# Patient Record
Sex: Female | Born: 1980 | Race: White | Hispanic: No | Marital: Married | Smoking: Never smoker
Health system: Southern US, Community
[De-identification: ages and names within clinical notes are randomized; demographics above are authoritative.]

## PROBLEM LIST (undated history)

## (undated) DIAGNOSIS — T7840XA Allergy, unspecified, initial encounter: Secondary | ICD-10-CM

## (undated) DIAGNOSIS — R87629 Unspecified abnormal cytological findings in specimens from vagina: Secondary | ICD-10-CM

## (undated) DIAGNOSIS — Z9889 Other specified postprocedural states: Secondary | ICD-10-CM

## (undated) DIAGNOSIS — J45909 Unspecified asthma, uncomplicated: Secondary | ICD-10-CM

## (undated) DIAGNOSIS — R112 Nausea with vomiting, unspecified: Secondary | ICD-10-CM

## (undated) HISTORY — PX: WISDOM TOOTH EXTRACTION: SHX21

## (undated) HISTORY — DX: Unspecified asthma, uncomplicated: J45.909

## (undated) HISTORY — DX: Unspecified abnormal cytological findings in specimens from vagina: R87.629

## (undated) HISTORY — PX: COLPOSCOPY: SHX161

## (undated) HISTORY — DX: Other specified postprocedural states: Z98.890

## (undated) HISTORY — DX: Other specified postprocedural states: R11.2

## (undated) HISTORY — DX: Allergy, unspecified, initial encounter: T78.40XA

---

## 2000-07-09 ENCOUNTER — Other Ambulatory Visit: Admission: RE | Admit: 2000-07-09 | Discharge: 2000-07-09 | Payer: Self-pay | Admitting: Obstetrics and Gynecology

## 2001-10-28 ENCOUNTER — Other Ambulatory Visit: Admission: RE | Admit: 2001-10-28 | Discharge: 2001-10-28 | Payer: Self-pay | Admitting: Obstetrics & Gynecology

## 2002-11-04 ENCOUNTER — Other Ambulatory Visit: Admission: RE | Admit: 2002-11-04 | Discharge: 2002-11-04 | Payer: Self-pay | Admitting: Obstetrics & Gynecology

## 2003-06-02 ENCOUNTER — Other Ambulatory Visit: Admission: RE | Admit: 2003-06-02 | Discharge: 2003-06-02 | Payer: Self-pay | Admitting: Obstetrics & Gynecology

## 2007-10-03 ENCOUNTER — Emergency Department (HOSPITAL_COMMUNITY): Admission: EM | Admit: 2007-10-03 | Discharge: 2007-10-03 | Payer: Self-pay | Admitting: Family Medicine

## 2007-10-14 ENCOUNTER — Encounter: Admission: RE | Admit: 2007-10-14 | Discharge: 2007-10-14 | Payer: Self-pay | Admitting: Gastroenterology

## 2008-12-13 ENCOUNTER — Emergency Department (HOSPITAL_COMMUNITY): Admission: EM | Admit: 2008-12-13 | Discharge: 2008-12-13 | Payer: Self-pay | Admitting: Family Medicine

## 2009-10-09 IMAGING — CT CT ABDOMEN W/ CM
2 of 5 series · 14 of 42 positions shown, 19 images · IV contrast (READICAT/WATER & [ID] OMNI 300)
Comparison: None

CT ABDOMEN

CLINICAL DATA: Right lower quadrant pain

CT ABDOMEN AND PELVIS WITH CONTRAST
TECHNIQUE: Multidetector CT imaging of the abdomen and pelvis was
performed using the standard protocol following bolus
administration of intravenous contrast.
Contrast: 100 ml

[Series 2: abdomen w/ · axial · 0.70mm/px · z∈[-340,+15]mm · 11 of 81 slices shown, 16 images]
[im 5/81  soft-tissue]
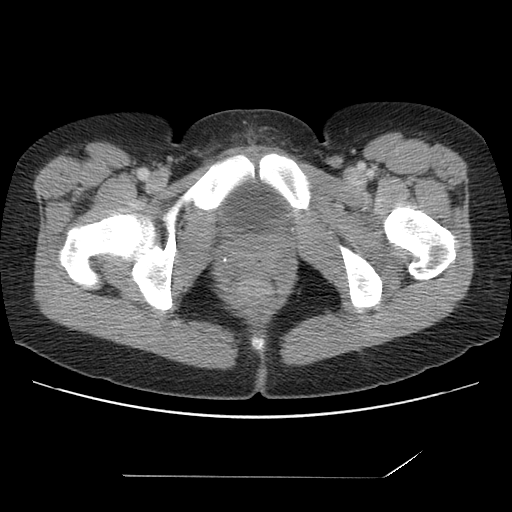
[im 5/81  bone]
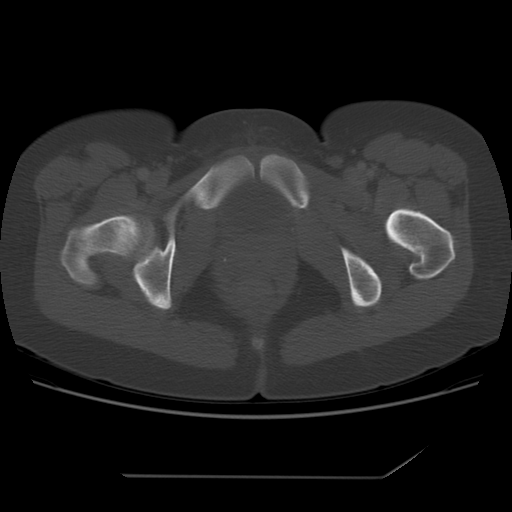
[im 15/81  soft-tissue]
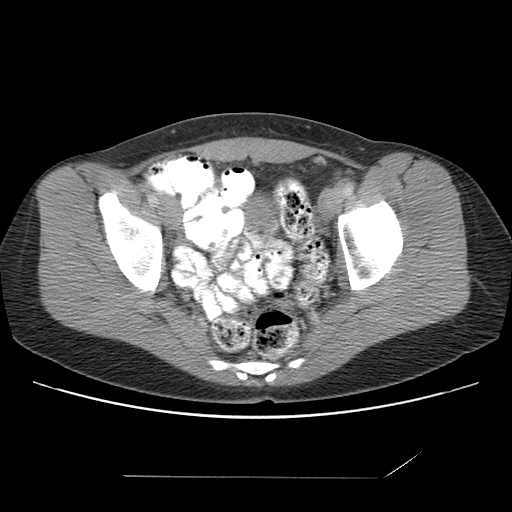
[im 24/81  soft-tissue]
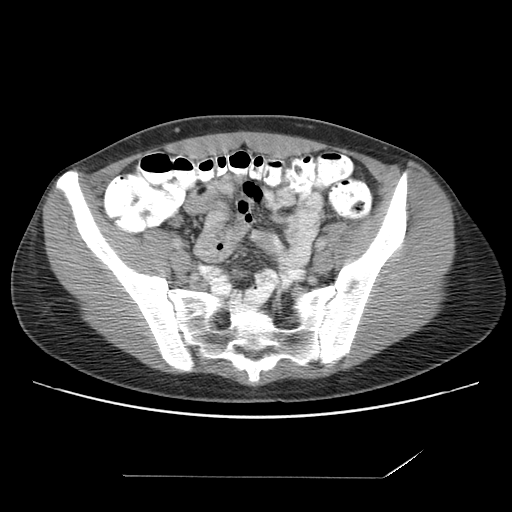
[im 29/81  soft-tissue]
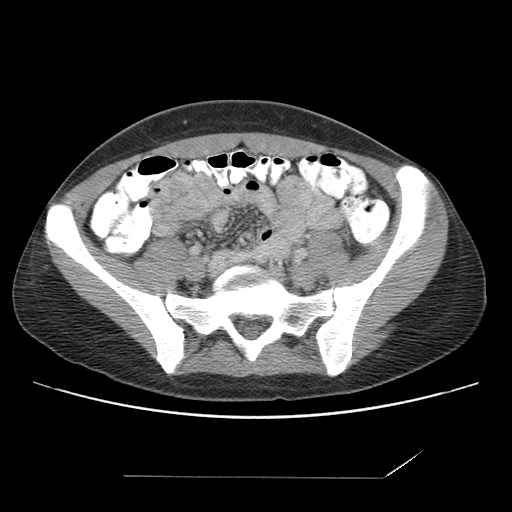
[im 38/81  soft-tissue]
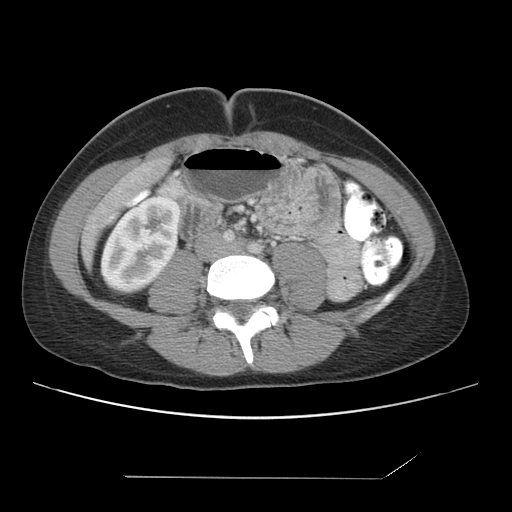
[im 43/81  soft-tissue]
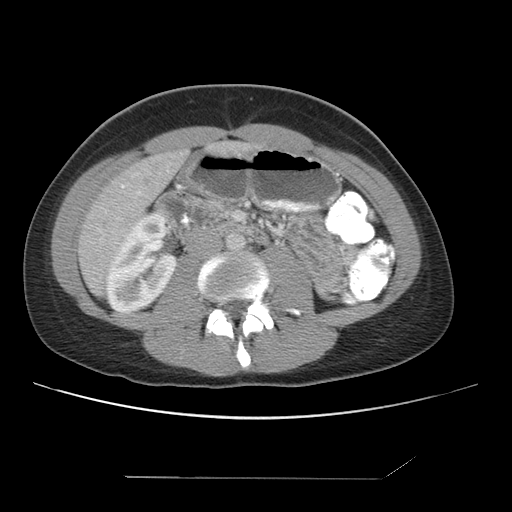
[im 52/81  soft-tissue]
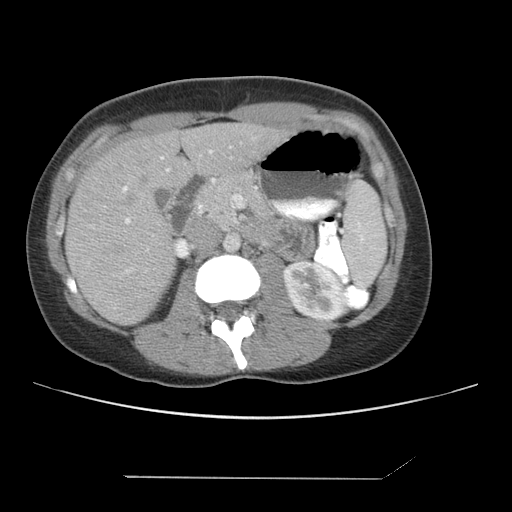
[im 62/81  soft-tissue]
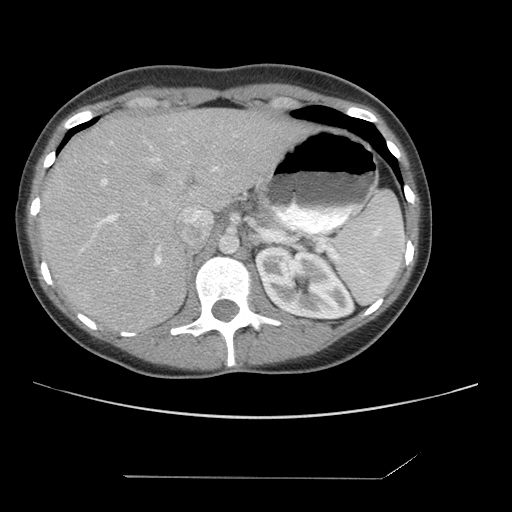
[im 62/81  lung]
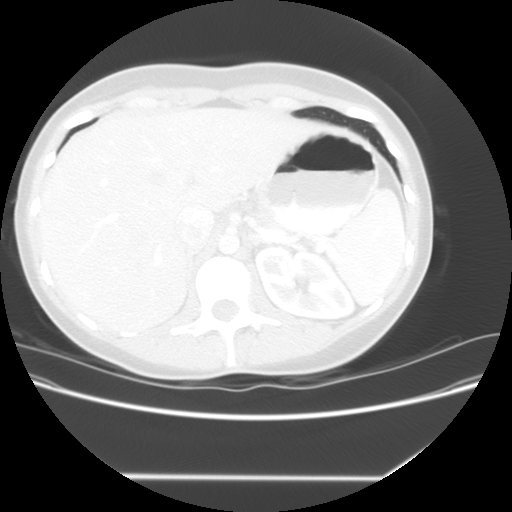
[im 66/81  soft-tissue]
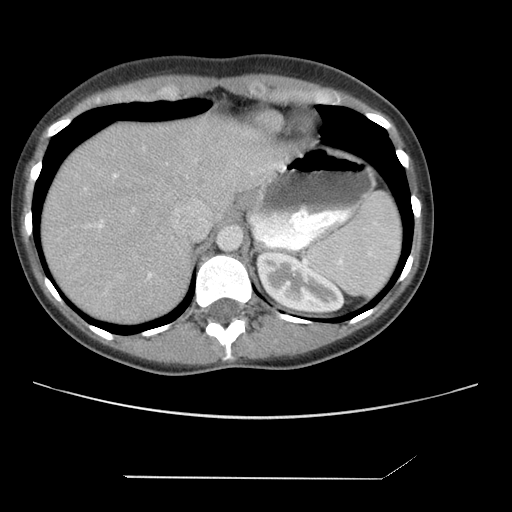
[im 66/81  lung]
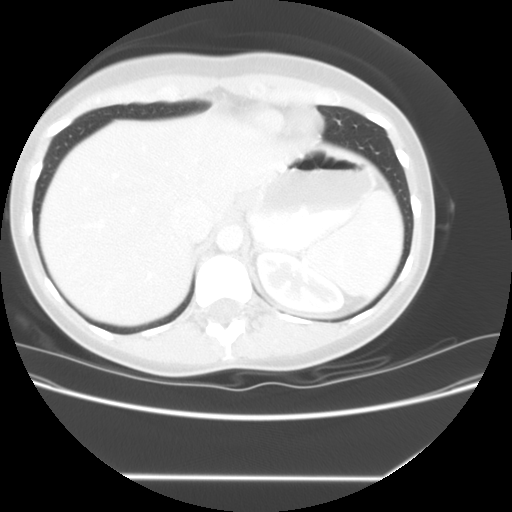
[im 66/81  bone]
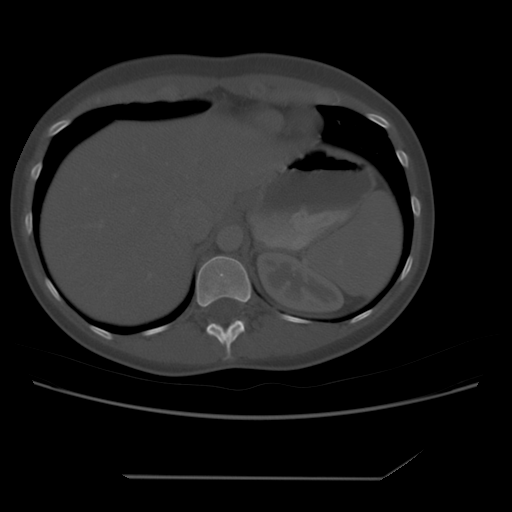
[im 71/81  lung]
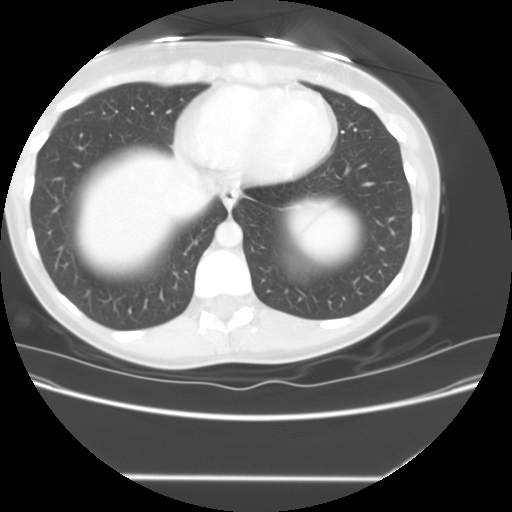
[im 76/81  soft-tissue]
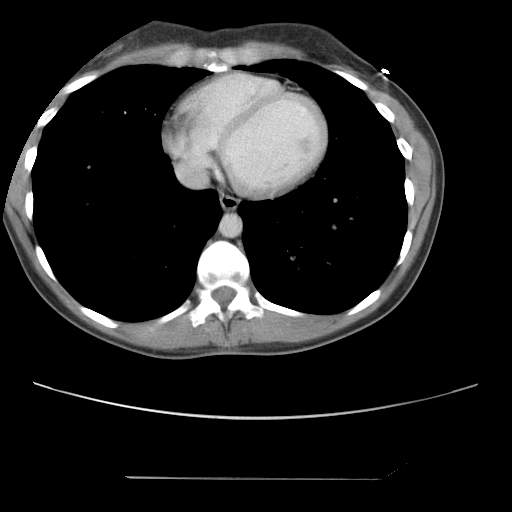
[im 76/81  lung]
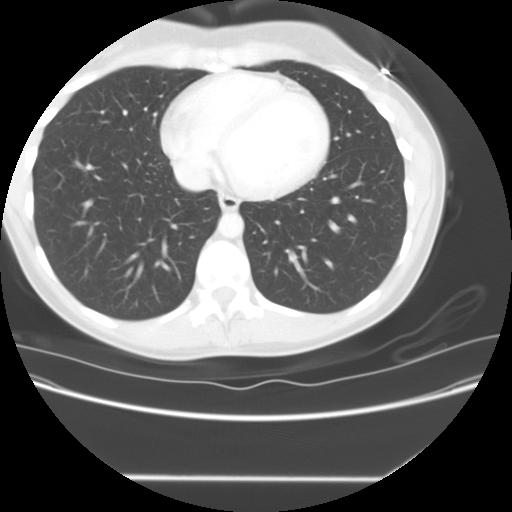

[Series 400: sagittal · sagittal · 0.86mm/px · 3 of 114 slices shown]
[im 29/114  soft-tissue]
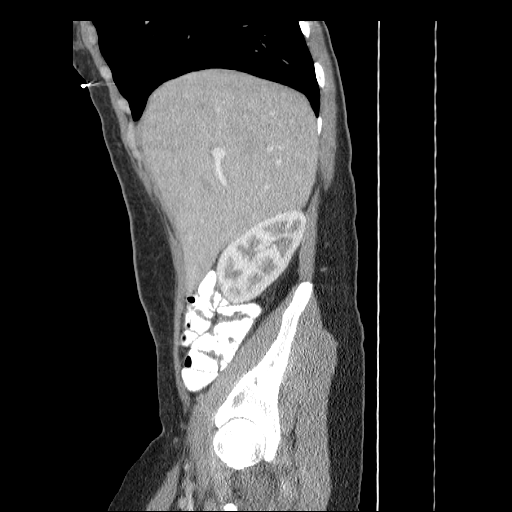
[im 57/114  soft-tissue]
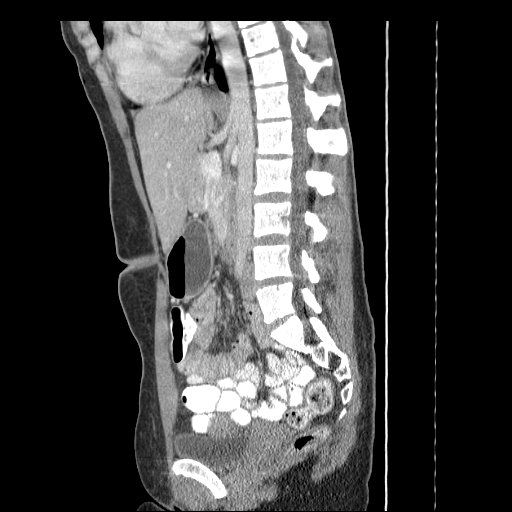
[im 85/114  soft-tissue]
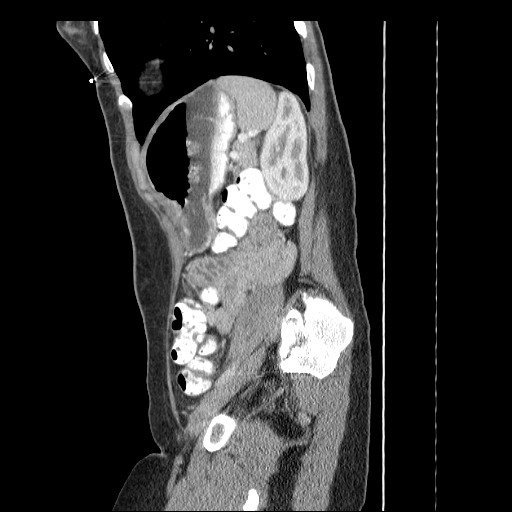

[14 of 42 positions shown; findings below may reference images not displayed]

FINDINGS: The lung bases are clear.  The liver enhances with no
focal abnormality and no ductal dilatation is seen.  The
gallbladder is contracted and no calcified gallstones are noted.
The pancreas is normal in size and the pancreatic duct is not
dilated.  The adrenal glands and spleen appear normal.  The kidneys
enhance and on delayed images the pelvocaliceal systems appear
normal.  The abdominal aorta is normal in caliber.
IMPRESSION: Negative CT the abdomen.

CT PELVIS
FINDINGS: The uterus is normal in size.  The urinary bladder is
unremarkable.  No adnexal lesion is seen.  The terminal ileum and
the appendix are moderately well seen with no abnormality evident.
No colonic abnormality is noted.
IMPRESSION: Negative CT the pelvis.  The appendix and terminal ileum appear
normal.  No fluid is seen within the pelvis.

## 2010-04-14 ENCOUNTER — Inpatient Hospital Stay (HOSPITAL_COMMUNITY)
Admission: AD | Admit: 2010-04-14 | Discharge: 2010-04-14 | Payer: Self-pay | Source: Home / Self Care | Attending: Obstetrics & Gynecology | Admitting: Obstetrics & Gynecology

## 2010-06-22 ENCOUNTER — Institutional Professional Consult (permissible substitution): Payer: BC Managed Care – PPO | Admitting: Pediatrics

## 2010-07-10 LAB — URINALYSIS, ROUTINE W REFLEX MICROSCOPIC
Glucose, UA: NEGATIVE mg/dL
Ketones, ur: NEGATIVE mg/dL
Leukocytes, UA: NEGATIVE
Specific Gravity, Urine: <1.005 — ABNORMAL LOW (ref 1.005–1.030)

## 2010-07-10 LAB — URINE MICROSCOPIC-ADD ON

## 2010-07-26 ENCOUNTER — Inpatient Hospital Stay (HOSPITAL_COMMUNITY)
Admission: AD | Admit: 2010-07-26 | Discharge: 2010-07-28 | DRG: 373 | Disposition: A | Discharge status: Home / Self Care | Payer: BC Managed Care – PPO | Source: Ambulatory Visit | Attending: Obstetrics & Gynecology | Admitting: Obstetrics & Gynecology

## 2010-07-26 LAB — TYPE AND SCREEN

## 2010-07-26 LAB — CBC
Hemoglobin: 13.5 g/dL (ref 12.0–15.0)
MCH: 32.1 pg (ref 26.0–34.0)
MCHC: 35.8 g/dL (ref 30.0–36.0)
RBC: 4.2 MIL/uL (ref 3.87–5.11)
RDW: 12.9 % (ref 11.5–15.5)

## 2010-07-27 LAB — CBC
MCH: 31.1 pg (ref 26.0–34.0)
MCHC: 34.4 g/dL (ref 30.0–36.0)
MCV: 90.6 fL (ref 78.0–100.0)
Platelets: 191 10*3/uL (ref 150–400)

## 2011-01-25 LAB — POCT URINALYSIS DIP (DEVICE)
Glucose, UA: NEGATIVE
Hgb urine dipstick: NEGATIVE
Operator id: 247071
Specific Gravity, Urine: 1.01
Urobilinogen, UA: 0.2
pH: 8.5 — ABNORMAL HIGH

## 2011-01-25 LAB — POCT PREGNANCY, URINE: Operator id: 247071

## 2014-04-22 ENCOUNTER — Emergency Department (HOSPITAL_COMMUNITY): Payer: 59

## 2014-04-22 ENCOUNTER — Emergency Department (HOSPITAL_COMMUNITY)
Admission: EM | Admit: 2014-04-22 | Discharge: 2014-04-23 | Disposition: A | Discharge status: Home / Self Care | Payer: 59 | Attending: Emergency Medicine | Admitting: Emergency Medicine

## 2014-04-22 ENCOUNTER — Encounter (HOSPITAL_COMMUNITY): Payer: Self-pay | Admitting: Emergency Medicine

## 2014-04-22 DIAGNOSIS — Y93B9 Activity, other involving muscle strengthening exercises: Secondary | ICD-10-CM | POA: Diagnosis not present

## 2014-04-22 DIAGNOSIS — Y9289 Other specified places as the place of occurrence of the external cause: Secondary | ICD-10-CM | POA: Diagnosis not present

## 2014-04-22 DIAGNOSIS — Z7951 Long term (current) use of inhaled steroids: Secondary | ICD-10-CM | POA: Insufficient documentation

## 2014-04-22 DIAGNOSIS — S4991XA Unspecified injury of right shoulder and upper arm, initial encounter: Secondary | ICD-10-CM | POA: Diagnosis not present

## 2014-04-22 DIAGNOSIS — Y998 Other external cause status: Secondary | ICD-10-CM | POA: Diagnosis not present

## 2014-04-22 DIAGNOSIS — M25519 Pain in unspecified shoulder: Secondary | ICD-10-CM

## 2014-04-22 DIAGNOSIS — X58XXXA Exposure to other specified factors, initial encounter: Secondary | ICD-10-CM | POA: Insufficient documentation

## 2014-04-22 DIAGNOSIS — Z79899 Other long term (current) drug therapy: Secondary | ICD-10-CM | POA: Insufficient documentation

## 2014-04-22 MED ORDER — METHOCARBAMOL 500 MG PO TABS: 1000.0000 mg | ORAL_TABLET | Freq: Three times a day (TID) | ORAL | Status: DC | PRN

## 2014-04-22 MED ORDER — KETOROLAC TROMETHAMINE 60 MG/2ML IM SOLN
60.0000 mg | Freq: Once | INTRAMUSCULAR | Status: AC
  Administered 2014-04-22: 60 mg via INTRAMUSCULAR
  Filled 2014-04-22: qty 2

## 2014-04-22 MED ORDER — METHOCARBAMOL 500 MG PO TABS
1000.0000 mg | ORAL_TABLET | Freq: Once | ORAL | Status: AC
  Administered 2014-04-22: 1000 mg via ORAL
  Filled 2014-04-22: qty 2

## 2014-04-22 MED ORDER — HYDROCODONE-ACETAMINOPHEN 5-325 MG PO TABS: 1.0000 | ORAL_TABLET | ORAL | Status: DC | PRN

## 2014-04-22 NOTE — ED Notes (Signed)
Pt presents with right shoulder pain onset today- admits to working out 2 days ago and noticing some pain during the workout.  Pt admits to pain with lifting arm, distal pulse intact, pt able to move fingers well.

## 2014-04-22 NOTE — Discharge Instructions (Signed)
Take medication as prescribed. May use heating pad. Gradually increase range of motion of right shoulder. Follow-up with the orthopedist should symptoms persist. Return immediately for numbness or weakness or any concerns.  Shoulder Pain The shoulder is the joint that connects your arms to your body. The bones that form the shoulder joint include the upper arm bone (humerus), the shoulder blade (scapula), and the collarbone (clavicle). The top of the humerus is shaped like a ball and fits into a rather flat socket on the scapula (glenoid cavity). A combination of muscles and strong, fibrous tissues that connect muscles to bones (tendons) support your shoulder joint and hold the ball in the socket. Small, fluid-filled sacs (bursae) are located in different areas of the joint. They act as cushions between the bones and the overlying soft tissues and help reduce friction between the gliding tendons and the bone as you move your arm. Your shoulder joint allows a wide range of motion in your arm. This range of motion allows you to do things like scratch your back or throw a ball. However, this range of motion also makes your shoulder more prone to pain from overuse and injury. Causes of shoulder pain can originate from both injury and overuse and usually can be grouped in the following four categories:  Redness, swelling, and pain (inflammation) of the tendon (tendinitis) or the bursae (bursitis).  Instability, such as a dislocation of the joint.  Inflammation of the joint (arthritis).  Broken bone (fracture). HOME CARE INSTRUCTIONS   Apply ice to the sore area.  Put ice in a plastic bag.  Place a towel between your skin and the bag.  Leave the ice on for 15-20 minutes, 3-4 times per day for the first 2 days, or as directed by your health care provider.  Stop using cold packs if they do not help with the pain.  If you have a shoulder sling or immobilizer, wear it as long as your caregiver  instructs. Only remove it to shower or bathe. Move your arm as little as possible, but keep your hand moving to prevent swelling.  Squeeze a soft ball or foam pad as much as possible to help prevent swelling.  Only take over-the-counter or prescription medicines for pain, discomfort, or fever as directed by your caregiver. SEEK MEDICAL CARE IF:   Your shoulder pain increases, or new pain develops in your arm, hand, or fingers.  Your hand or fingers become cold and numb.  Your pain is not relieved with medicines. SEEK IMMEDIATE MEDICAL CARE IF:   Your arm, hand, or fingers are numb or tingling.  Your arm, hand, or fingers are significantly swollen or turn white or blue. MAKE SURE YOU:   Understand these instructions.  Will watch your condition.  Will get help right away if you are not doing well or get worse. Document Released: 01/24/2005 Document Revised: 08/31/2013 Document Reviewed: 03/31/2011 [facility] Patient Information 2015 [place], [place]. This information is not intended to replace advice given to you by your health care provider. Make sure you discuss any questions you have with your health care provider.

## 2014-04-22 NOTE — ED Provider Notes (Signed)
CSN: [id number]     Arrival date & time 04/22/14  2214 History  This chart was scribe for Julianne Rice, MD by Judithann Sauger, ED Scribe. The patient was seen in room A11C/A11C and the patient's care was started at 11:16 PM.    Chief Complaint  Patient presents with  . Shoulder Pain   Patient is a 33 y.o. female presenting with shoulder pain. The history is provided by the patient. No language interpreter was used.  Shoulder Pain Location:  Shoulder Time since incident:  2 days Shoulder location:  R shoulder Pain details:    Radiates to:  Does not radiate   Onset quality:  Gradual   Duration:  2 days   Timing:  Constant   Progression:  Worsening Chronicity:  New Handedness:  Right-handed Dislocation: no   Foreign body present:  No foreign bodies Relieved by:  None tried Worsened by:  Nothing tried Ineffective treatments:  None tried Associated symptoms: no neck pain    HPI Comments: Lacey Franklin is a 33 y.o. female who presents to the Emergency Department complaining of a constant right shoulder pain after heavy exercise 2 days ago. She states that she did barbell complexes with 80 pounds and 91 burpies during the work out. She denies injury during exercise. She adds that she took ibuprofen for the pain and placed ice on it with the last dose of ibuprofen tonight at 9 pm with no relief. Patient has right pain with range of motion but denies any focal weakness or numbness. No deformity, swelling, erythema.   History reviewed. No pertinent past medical history. History reviewed. No pertinent past surgical history. No family history on file. History  Substance Use Topics  . Smoking status: Never Smoker   . Smokeless tobacco: Not on file  . Alcohol Use: Yes   OB History    No data available     Review of Systems  Musculoskeletal: Positive for myalgias and arthralgias (right shoulder). Negative for joint swelling, neck pain and neck stiffness.  Skin: Negative for rash  and wound.  Neurological: Negative for weakness and numbness.  All other systems reviewed and are negative.     Allergies  Azithromycin  Home Medications   Prior to Admission medications   Medication Sig Start Date End Date Taking? Authorizing Provider  CALCIUM-VITAMIN D PO Take 1 tablet by mouth daily.   Yes Historical Provider, MD  drospirenone-ethinyl estradiol (YAZ,GIANVI,LORYNA) 3-0.02 MG tablet Take 1 tablet by mouth daily.   Yes Historical Provider, MD  fluticasone (FLONASE) 50 MCG/ACT nasal spray Place 1 spray into both nostrils as needed for allergies or rhinitis.   Yes Historical Provider, MD  ibuprofen (ADVIL,MOTRIN) 200 MG tablet Take 800 mg by mouth every 6 (six) hours as needed for moderate pain.    Yes Historical Provider, MD  loratadine (CLARITIN) 10 MG tablet Take 10 mg by mouth daily.   Yes Historical Provider, MD  Magnesium 250 MG TABS Take 250 mg by mouth 4 (four) times a week.   Yes Historical Provider, MD  Multiple Vitamin (MULTIVITAMIN WITH MINERALS) TABS tablet Take 1 tablet by mouth daily.   Yes Historical Provider, MD  Probiotic Product (PROBIOTIC DAILY) CAPS Take 1 capsule by mouth daily.   Yes Historical Provider, MD  rizatriptan (MAXALT-MLT) 10 MG disintegrating tablet Take 10 mg by mouth as needed for migraine. May repeat in 2 hours if needed   Yes Historical Provider, MD   BP 129/75 mmHg  Pulse 87  Temp(Src)  98.6 F (37 C) (Oral)  Resp 18  SpO2 99%  LMP 03/30/2014 Physical Exam  Constitutional: She is oriented to person, place, and time. She appears well-developed and well-nourished. No distress.  HENT:  Head: Normocephalic and atraumatic.  Mouth/Throat: Oropharynx is clear and moist.  Eyes: EOM are normal. Pupils are equal, round, and reactive to light.  Neck: Normal range of motion. Neck supple.  No posterior midline cervical tenderness to palpation.  Cardiovascular: Normal rate and regular rhythm.   Pulmonary/Chest: Effort normal and breath  sounds normal. No respiratory distress. She has no wheezes. She has no rales.  Abdominal: Soft. Bowel sounds are normal.  Musculoskeletal: Normal range of motion. She exhibits tenderness. She exhibits no edema.  Patient with tenderness to palpation at the insertion of the pectoralis into the humerus. She has normal range of motion of the right shoulder though pain with abduction. Distal pulses are intact. No obvious deformity or effusion.  Neurological: She is alert and oriented to person, place, and time.  5/5 grip strength. Sensation is fully intact to the right upper extremity.  Skin: Skin is warm and dry. No rash noted. No erythema.  Psychiatric: She has a normal mood and affect. Her behavior is normal.  Nursing note and vitals reviewed.   ED Course  Procedures (including critical care time) DIAGNOSTIC STUDIES: Oxygen Saturation is 99% on RA, normal by my interpretation.    COORDINATION OF CARE: 11:23 PM- Pt advised of plan for treatment and pt agrees.    Labs Review Labs Reviewed - No data to display  Imaging Review Dg Shoulder Right  04/22/2014   CLINICAL DATA:  Acute onset of anterior right shoulder pain, radiating to the neck and elbow. Initial encounter.  EXAM: RIGHT SHOULDER - 2+ VIEW  COMPARISON:  None.  FINDINGS: There is no evidence of fracture or dislocation. The right humeral head is seated within the glenoid fossa. The acromioclavicular joint is unremarkable in appearance. No significant soft tissue abnormalities are seen. The visualized portions of the right lung are clear.  IMPRESSION: No evidence of fracture or dislocation.   Electronically Signed   By: Garald Balding M.D.   On: 04/22/2014 23:11     EKG Interpretation None      MDM   Final diagnoses:  Shoulder pain      I personally performed the services described in this documentation, which was scribed in my presence. The recorded information has been reviewed and is accurate.   Suspect muscular  injury. X-ray without acute findings. Will treat with muscle relaxants and anti-inflammatory. Patient advised to increase range of motion as able. She will need to follow-up with an orthopedist should her symptoms continue. It is impossible to rule out rotator cuff injury or bursitis though I believe this is less likely. If symptoms continue the patient may need MRI. She's been given return precautions and has voice understanding.   Julianne Rice, MD 04/24/14 [phone]

## 2014-04-23 NOTE — ED Notes (Signed)
Pt. Refused wheelchair and left with all belongings 

## 2014-09-23 ENCOUNTER — Ambulatory Visit (INDEPENDENT_AMBULATORY_CARE_PROVIDER_SITE_OTHER): Payer: 59 | Admitting: Physician Assistant

## 2014-09-23 VITALS — BP 132/80 | HR 70 | Temp 98.7°F | Resp 17 | Ht 67.5 in | Wt 156.0 lb

## 2014-09-23 DIAGNOSIS — H109 Unspecified conjunctivitis: Secondary | ICD-10-CM

## 2014-09-23 MED ORDER — OFLOXACIN 0.3 % OP SOLN: 1.0000 [drp] | Freq: Four times a day (QID) | OPHTHALMIC | Status: AC

## 2014-09-23 NOTE — Progress Notes (Signed)
[facility] [address], [place] [address] [phone]  Date:  09/23/2014   Name:  Lacey Franklin   DOB:  28-Sep-1980   MRN:  [id number]  PCP:  No primary care provider on file.    Chief Complaint: Eye Burn and Sinusitis   History of Present Illness:  This is a 34 y.o. female with PMH allergic rhinitis who is presenting with eye burning and redness x 1 day. Reports 6 days ago she was on vacation at the beach. She thought her allergies were acting up. She was having ear popping and sinus pressure. In the past 24 hours she has started having rhinorrhea and is feeling better in that respect. Her son was diagnosed with bacterial conjunctivitis 3 days ago and is using eyedrops. Last night she started having eye irritation. She states "it feels like there is sand in my eye". This morning her eyes red and starting to produce discharge. She denies eye pain or blurred vision. She does not feel like she has a foreign body in her eye but states her eye feels "scratchy". She denies fever, chills, other URI symptoms. She works as a Advice worker.  Review of Systems:  Review of Systems  Constitutional: Negative for fever and chills.  HENT: Positive for rhinorrhea and sinus pressure. Negative for ear pain and sore throat.   Eyes: Positive for discharge and redness. Negative for photophobia, pain, itching and visual disturbance.  Respiratory: Negative for cough.   Gastrointestinal: Negative for nausea and vomiting.  Skin: Negative for rash.  Allergic/Immunologic: Positive for environmental allergies.  Hematological: Negative for adenopathy.    There are no active problems to display for this patient.   Prior to Admission medications   Medication Sig Start Date End Date Taking? Authorizing Provider  CALCIUM-VITAMIN D PO Take 1 tablet by mouth daily.   Yes Historical Provider, MD  fluticasone (FLONASE) 50 MCG/ACT nasal spray Place 1 spray into both nostrils as needed  for allergies or rhinitis.   Yes Historical Provider, MD  ibuprofen (ADVIL,MOTRIN) 200 MG tablet Take 800 mg by mouth every 6 (six) hours as needed for moderate pain.    Yes Historical Provider, MD  loratadine (CLARITIN) 10 MG tablet Take 10 mg by mouth daily.   Yes Historical Provider, MD  Magnesium 250 MG TABS Take 250 mg by mouth 4 (four) times a week.   Yes Historical Provider, MD  methocarbamol (ROBAXIN) 500 MG tablet Take 2 tablets (1,000 mg total) by mouth every 8 (eight) hours as needed for muscle spasms. 04/22/14  Yes Julianne Rice, MD  Multiple Vitamin (MULTIVITAMIN WITH MINERALS) TABS tablet Take 1 tablet by mouth daily.   Yes Historical Provider, MD  Probiotic Product (PROBIOTIC DAILY) CAPS Take 1 capsule by mouth daily.   Yes Historical Provider, MD  rizatriptan (MAXALT-MLT) 10 MG disintegrating tablet Take 10 mg by mouth as needed for migraine. May repeat in 2 hours if needed   Yes Historical Provider, MD    Allergies  Allergen Reactions  . Azithromycin Other (See Comments)    History reviewed. No pertinent past surgical history.  History  Substance Use Topics  . Smoking status: Never Smoker   . Smokeless tobacco: Not on file  . Alcohol Use: Yes    Family History  Problem Relation Age of Onset  . Cancer Mother   . Cancer Father   . Cancer Maternal Grandmother   . Parkinson's disease Maternal Grandmother   . Cancer Maternal Grandfather   .  Heart disease Paternal Grandmother   . Hypertension Paternal Grandmother   . Heart disease Paternal Grandfather   . Parkinson's disease Paternal Grandfather     Medication list has been reviewed and updated.  Physical Examination:  Physical Exam  Constitutional: She is oriented to person, place, and time. She appears well-developed and well-nourished. No distress.  HENT:  Head: Normocephalic and atraumatic.  Right Ear: Hearing, external ear and ear canal normal. Tympanic membrane is retracted.  Left Ear: Hearing,  external ear and ear canal normal. Tympanic membrane is retracted.  Nose: Nose normal. Right sinus exhibits no maxillary sinus tenderness and no frontal sinus tenderness. Left sinus exhibits no maxillary sinus tenderness and no frontal sinus tenderness.  Mouth/Throat: Uvula is midline, oropharynx is clear and moist and mucous membranes are normal.  Eyes: EOM are normal. Pupils are equal, round, and reactive to light. Right eye exhibits discharge (green in color, located in medial corner of eye). Left eye exhibits no discharge. Right conjunctiva is injected. No scleral icterus.  Proparacaine applied to eye. fluoroscein dye applied and examined under lamp - no uptake over cornea noted.  Cardiovascular: Normal rate, regular rhythm, normal heart sounds and normal pulses.   No murmur heard. Pulmonary/Chest: Effort normal. No respiratory distress. She has no wheezes. She has no rhonchi. She has no rales.  Musculoskeletal: Normal range of motion.  Lymphadenopathy:       Head (right side): No submental, no submandibular and no tonsillar adenopathy present.       Head (left side): No submental, no submandibular and no tonsillar adenopathy present.    She has no cervical adenopathy.  Neurological: She is alert and oriented to person, place, and time.  Skin: Skin is warm, dry and intact. No lesion and no rash noted.  Psychiatric: She has a normal mood and affect. Her speech is normal and behavior is normal. Thought content normal.    BP 132/80 mmHg  Pulse 70  Temp(Src) 98.7 F (37.1 C) (Oral)  Resp 17  Ht 5' 7.5" (1.715 m)  Wt 156 lb (70.761 kg)  BMI 24.06 kg/m2  SpO2 98%  LMP 09/09/2014   Visual Acuity Screening   Right eye Left eye Both eyes  Without correction:     With correction: 20/15 20/15 20/15     Assessment and Plan:  1. Conjunctivitis of right eye Bacterial conjunctivitis likely due to purulent discharge in corner of the eye. No corneal uptake with fluorescein dye seen under  [place] lamp. Will treat with ofloxacin drops for the next 7 days. She will let me know if symptoms are not starting to improve in the next 72 hours. - ofloxacin (OCUFLOX) 0.3 % ophthalmic solution; Place 1 drop into the right eye 4 (four) times daily.  Dispense: 5 mL; Refill: 0   Nicole V. Drenda Freeze, MHS [facility]  09/23/2014

## 2014-09-23 NOTE — Patient Instructions (Signed)
Use eye drops four times a day in your right eye for 7 days. If moves to left eye do the same. Practice good hand hygiene. Return if not getting better in 3-4 days.

## 2015-03-15 ENCOUNTER — Ambulatory Visit (INDEPENDENT_AMBULATORY_CARE_PROVIDER_SITE_OTHER): Payer: 59 | Admitting: Family Medicine

## 2015-03-15 VITALS — BP 130/80 | HR 89 | Temp 98.1°F | Resp 16 | Ht 67.0 in | Wt 158.0 lb

## 2015-03-15 DIAGNOSIS — F419 Anxiety disorder, unspecified: Secondary | ICD-10-CM

## 2015-03-15 DIAGNOSIS — I499 Cardiac arrhythmia, unspecified: Secondary | ICD-10-CM | POA: Diagnosis not present

## 2015-03-15 DIAGNOSIS — R002 Palpitations: Secondary | ICD-10-CM | POA: Diagnosis not present

## 2015-03-15 LAB — CBC
HEMATOCRIT: 41.3 % (ref 36.0–46.0)
HEMOGLOBIN: 14.2 g/dL (ref 12.0–15.0)
MCH: 31.3 pg (ref 26.0–34.0)
MCHC: 34.4 g/dL (ref 30.0–36.0)
MCV: 91.2 fL (ref 78.0–100.0)
MPV: 10.1 fL (ref 8.6–12.4)
PLATELETS: 300 10*3/uL (ref 150–400)
RBC: 4.53 MIL/uL (ref 3.87–5.11)
RDW: 12.5 % (ref 11.5–15.5)
WBC: 6.2 10*3/uL (ref 4.0–10.5)

## 2015-03-15 LAB — COMPREHENSIVE METABOLIC PANEL
ALBUMIN: 4.3 g/dL (ref 3.6–5.1)
ALT: 14 U/L (ref 6–29)
AST: 19 U/L (ref 10–30)
Alkaline Phosphatase: 47 U/L (ref 33–115)
BUN: 10 mg/dL (ref 7–25)
CHLORIDE: 103 mmol/L (ref 98–110)
CO2: 25 mmol/L (ref 20–31)
CREATININE: 0.67 mg/dL (ref 0.50–1.10)
Calcium: 9.7 mg/dL (ref 8.6–10.2)
Glucose, Bld: 102 mg/dL — ABNORMAL HIGH (ref 65–99)
POTASSIUM: 4.5 mmol/L (ref 3.5–5.3)
SODIUM: 139 mmol/L (ref 135–146)
TOTAL PROTEIN: 6.4 g/dL (ref 6.1–8.1)
Total Bilirubin: 0.6 mg/dL (ref 0.2–1.2)

## 2015-03-15 MED ORDER — ALPRAZOLAM 0.25 MG PO TABS: ORAL_TABLET | ORAL | Status: DC

## 2015-03-15 NOTE — Patient Instructions (Signed)
I will let you know the results of your labs in a few days  Referral is being made to cardiology for evaluation for the palpitations  Take the alprazolam only if needed for impending panic attack sensation.  Return as needed  I believe it is safe for you to continue exercising at this time.

## 2015-03-15 NOTE — Progress Notes (Signed)
Patient ID: Lacey Franklin, female    DOB: 1980-11-11  Age: 34 y.o. MRN: [id number]  Chief Complaint  Patient presents with  . Fatigue  . Irregular Heart Beat    Subjective:   Patient has been feeling palpitations lately. She feels like her heart races and slows down. 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 She did not see ectopy, just spitting up and slowing down. She has some chest discomfort with this, not true pain. She does like to work out and run. She has been under more stress lately. Her husband got a new job. 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 She drinks a lot of coffee to stay awake, will rule her shifts at night. She goes home and only gets about 4-1/2 hours of sleep because she has a child. She has never been on medicines for anxiety. She is not on any major meds of some supplements and her birth control. She is not pregnant.  Current allergies, medications, problem list, past/family and social histories reviewed.  Objective:  BP 130/80 mmHg  Pulse 89  Temp(Src) 98.1 F (36.7 C) (Oral)  Resp 16  Ht 5\' 7"  (1.702 m)  Wt 158 lb (71.668 kg)  BMI 24.74 kg/m2  SpO2 97%  Healthy-appearing young lady, somewhat anxious. Throat clear. No thyromegaly. Chest clear. Heart regular without gallop. Abdomen soft without masses or tenderness.  Assessment & Plan:   Assessment: 1. Irregular heart beat   2. Palpitations   3. Anxiety       Plan:   Orders Placed This Encounter  Procedures  . TSH  . Comprehensive metabolic panel  . CBC  . Ambulatory referral to Cardiology    Referral Priority:  Routine    Referral Type:  Consultation    Referral Reason:  Specialty Services Required    Referred to Provider:  Jettie Booze, MD    Requested Specialty:  Cardiology    Number of Visits Requested:  1  . EKG 12-Lead    Meds ordered this  encounter  Medications  . Norethin-Eth Estrad-Fe Biphas (LO LOESTRIN FE PO)    Sig: Take by mouth.  . ALPRAZolam (XANAX) 0.25 MG tablet    Sig: Take 1/2-1 pill only if needed for anxiety or panic attack.    Dispense:  10 tablet    Refill:  0         Patient Instructions  I will let you know the results of your labs in a few days  Referral is being made to cardiology for evaluation for the palpitations  Take the alprazolam only if needed for impending panic attack sensation.  Return as needed  I believe it is safe for you to continue exercising at this time.     Return if symptoms worsen or fail to improve.   HOPPER,DAVID, MD 03/15/2015

## 2015-03-16 LAB — TSH: TSH: 1.161 u[IU]/mL (ref 0.350–4.500)

## 2015-04-19 ENCOUNTER — Ambulatory Visit (INDEPENDENT_AMBULATORY_CARE_PROVIDER_SITE_OTHER): Payer: 59 | Admitting: Interventional Cardiology

## 2015-04-19 ENCOUNTER — Encounter: Payer: Self-pay | Admitting: Interventional Cardiology

## 2015-04-19 VITALS — BP 140/75 | HR 78 | Ht 67.5 in | Wt 160.0 lb

## 2015-04-19 DIAGNOSIS — R002 Palpitations: Secondary | ICD-10-CM

## 2015-04-19 DIAGNOSIS — R5382 Chronic fatigue, unspecified: Secondary | ICD-10-CM | POA: Diagnosis not present

## 2015-04-19 NOTE — Progress Notes (Signed)
Patient ID: Lacey Franklin, female   DOB: 03-Oct-1980, 34 y.o.   MRN: [id number]     Cardiology Office Note   Date:  04/19/2015   ID:  Lacey Franklin, DOB 27-Jun-1980, MRN [id number]  PCP:  Osborne Casco, MD    No chief complaint on file.  palpitations   Wt Readings from Last 3 Encounters:  04/19/15 160 lb (72.576 kg)  03/15/15 158 lb (71.668 kg)  09/23/14 156 lb (70.761 kg)       History of Present Illness: Lacey Franklin is a 34 y.o. female  with no significant cardiac history. In November, she had episodic palpitations for 2-3 days. There was a fluttering in her chest. She was quite fatigued. It was quite stressful for her. She has been working more. She works third shift and a sleep lab. She hooked herself to the monitor in the sleep lab. There were no significant oxygen desaturations but some short-lived arrhythmias which appeared like PACs were discovered. She went to urgent care. She had an EKG in which there is a current concern about ST depressions. The patient denies any chest discomfort or shortness of breath.  She exercises regularly. She pushes herself doing cardio type exercises and has no cardiac symptoms. Her biggest complaint is that her sleep cycle is disrupted. She has trouble sleeping during the day. She works 3 nights and then will be off for 3 or 4 days. She feels extremely tired and overall stressed. Her husband worked at [employer] cardiology office in the past as a traveling Facilities manager.  He now works as a Network engineer for [employer].  In the past for weeks, she has not had any palpitations.    Past Medical History  Diagnosis Date  . Allergy   . Asthma     History reviewed. No pertinent past surgical history.   Current Outpatient Prescriptions  Medication Sig Dispense Refill  . ALPRAZolam (XANAX) 0.25 MG tablet Take 1/2-1 pill only if needed for anxiety or panic attack. 10 tablet 0  . CALCIUM-VITAMIN D PO Take 1 tablet by mouth  daily.    . fluticasone (FLONASE) 50 MCG/ACT nasal spray Place 1 spray into both nostrils as needed for allergies or rhinitis.    Marland Kitchen ibuprofen (ADVIL,MOTRIN) 200 MG tablet Take 800 mg by mouth every 6 (six) hours as needed for moderate pain.     . LO LOESTRIN FE 1 MG-10 MCG / 10 MCG tablet Take 1 tablet by mouth daily.   3  . loratadine (CLARITIN) 10 MG tablet Take 10 mg by mouth daily.    . Magnesium 250 MG TABS Take 250 mg by mouth 4 (four) times a week.    . Multiple Vitamin (MULTIVITAMIN WITH MINERALS) TABS tablet Take 1 tablet by mouth daily.    Cyndie Chime Estrad-Fe Biphas (LO LOESTRIN FE PO) Take by mouth.    . Probiotic Product (PROBIOTIC DAILY) CAPS Take 1 capsule by mouth daily.    . rizatriptan (MAXALT-MLT) 10 MG disintegrating tablet Take 10 mg by mouth as needed for migraine. May repeat in 2 hours if needed     No current facility-administered medications for this visit.    Allergies:   Azithromycin    Social History:  The patient  reports that she has never smoked. She does not have any smokeless tobacco history on file. She reports that she drinks alcohol. She reports that she does not use illicit drugs.   Family History:  The patient's family history includes Cancer  in her father, maternal grandfather, maternal grandmother, and mother; Heart disease in her paternal grandfather and paternal grandmother; Hypertension in her paternal grandmother; Parkinson's disease in her maternal grandmother and paternal grandfather. There is no history of Heart attack or Stroke.    ROS:  Please see the history of present illness.   Otherwise, review of systems are positive for fatigue, palpitations.   All other systems are reviewed and negative.    PHYSICAL EXAM: VS:  BP 140/75 mmHg  Pulse 78  Ht 5' 7.5" (1.715 m)  Wt 160 lb (72.576 kg)  BMI 24.68 kg/m2 , BMI Body mass index is 24.68 kg/(m^2). GEN: Well nourished, well developed, in no acute distress HEENT: normal Neck: no JVD,  carotid bruits, or masses Cardiac: RRR; no murmurs, rubs, or gallops,no edema  Respiratory:  clear to auscultation bilaterally, normal work of breathing GI: soft, nontender, nondistended, + BS MS: no deformity or atrophy Skin: warm and dry, no rash Neuro:  Strength and sensation are intact Psych: euthymic mood, full affect   EKG:   The ekg ordered from November 15 demonstrates normal sinus rhythm with nonspecific ST segment changes in the inferior and lateral leads   Recent Labs: 03/15/2015: ALT 14; BUN 10; Creat 0.67; Hemoglobin 14.2; Platelets 300; Potassium 4.5; Sodium 139; TSH 1.161   Lipid Panel No results found for: CHOL, TRIG, HDL, CHOLHDL, VLDL, LDLCALC, LDLDIRECT   Other studies Reviewed: Additional studies/ records that were reviewed today with results demonstrating: Prior ECG reviewed. Recent labs reviewed showing normal TSH and normal electrolytes   ASSESSMENT AND PLAN:  1. Palpitations: She has decreased caffeine. She is trying to get more sleep. Her palpitations have resolved. At this point, I don't think a Holter monitor or event monitor would be useful. If her symptoms resolve, she will let us know and we will arrange for a monitor. 2. Fatigue: It sounds like she is having issues with her sleep.  She has been using Xanax to help with her sleep. She does not like the way Xanax makes her feel. I recommend that she try something else. She will be meeting with Dr. Kelton Pillar to be her primary care physician.   3. No early coronary disease in the family. No early atrial fibrillation in the family. Given her normal exam, will just let her follow-up as needed depending on her symptoms. She will continue the lifestyle modifications of trying to get a more regular sleep cycle and minimizing caffeine.  Given the strenuous exercise that she does without cardiac symptoms, I highly doubt  ischemia in this patient.  The patient and her husband are active in [place]. If she does  develop chest discomfort or sudden change in exercise tolerance, she will let us know   Current medicines are reviewed at length with the patient today.  The patient concerns regarding her medicines were addressed.  The following changes have been made:  No change   Labs/ tests ordered today include:  No orders of the defined types were placed in this encounter.    Recommend 150 minutes/week of aerobic exercise Low fat, low carb, high fiber diet recommended  Disposition:   FU in prn   Teresita Madura., MD  04/19/2015 12:31 PM    [facility] [address] [place], [place]  [address] Phone: [phone]; Fax: [phone]

## 2015-04-19 NOTE — Patient Instructions (Signed)
**Note De-Identified Josey Forcier Obfuscation** Medication Instructions:  Same-no change  Labwork: None  Testing/Procedures: None  Follow-Up: Your physician recommends that you schedule a follow-up appointment in: as needed     If you need a refill on your cardiac medications before your next appointment, please call your pharmacy.

## 2015-04-28 ENCOUNTER — Encounter: Payer: Self-pay | Admitting: Interventional Cardiology

## 2015-05-04 DIAGNOSIS — F39 Unspecified mood [affective] disorder: Secondary | ICD-10-CM | POA: Diagnosis not present

## 2015-05-04 DIAGNOSIS — G43909 Migraine, unspecified, not intractable, without status migrainosus: Secondary | ICD-10-CM | POA: Diagnosis not present

## 2015-05-20 MED FILL — RIZATRIPTAN 10 MG TABLET: 10 | 30 days supply | Qty: 9 | Fill #0

## 2015-06-05 ENCOUNTER — Ambulatory Visit (INDEPENDENT_AMBULATORY_CARE_PROVIDER_SITE_OTHER): Payer: 59 | Admitting: Physician Assistant

## 2015-06-05 VITALS — BP 118/70 | HR 82 | Temp 99.0°F | Resp 12 | Ht 67.5 in | Wt 162.0 lb

## 2015-06-05 DIAGNOSIS — R05 Cough: Secondary | ICD-10-CM | POA: Diagnosis not present

## 2015-06-05 DIAGNOSIS — Z8709 Personal history of other diseases of the respiratory system: Secondary | ICD-10-CM | POA: Diagnosis not present

## 2015-06-05 DIAGNOSIS — R059 Cough, unspecified: Secondary | ICD-10-CM

## 2015-06-05 LAB — POCT CBC
Granulocyte percent: 72.8 %G (ref 37–80)
HEMATOCRIT: 39.9 % (ref 37.7–47.9)
Hemoglobin: 14 g/dL (ref 12.2–16.2)
LYMPH, POC: 1.7 (ref 0.6–3.4)
MCH, POC: 31.9 pg — AB (ref 27–31.2)
MCHC: 35 g/dL (ref 31.8–35.4)
MCV: 91.3 fL (ref 80–97)
MID (CBC): 0.6 (ref 0–0.9)
MPV: 7.4 fL (ref 0–99.8)
POC GRANULOCYTE: 6.3 (ref 2–6.9)
POC LYMPH %: 19.9 % (ref 10–50)
POC MID %: 7.3 %M (ref 0–12)
Platelet Count, POC: 232 10*3/uL (ref 142–424)
RBC: 4.37 M/uL (ref 4.04–5.48)
RDW, POC: 12.1 %
WBC: 8.6 10*3/uL (ref 4.6–10.2)

## 2015-06-05 MED ORDER — IPRATROPIUM BROMIDE 0.02 % IN SOLN
0.5000 mg | Freq: Once | RESPIRATORY_TRACT | Status: AC
  Administered 2015-06-05: 0.5 mg via RESPIRATORY_TRACT

## 2015-06-05 MED ORDER — ALBUTEROL SULFATE (2.5 MG/3ML) 0.083% IN NEBU
2.5000 mg | INHALATION_SOLUTION | Freq: Four times a day (QID) | RESPIRATORY_TRACT | Status: DC | PRN
Start: 2015-06-05 — End: 2016-04-04

## 2015-06-05 MED ORDER — ALBUTEROL SULFATE (2.5 MG/3ML) 0.083% IN NEBU
2.5000 mg | INHALATION_SOLUTION | Freq: Once | RESPIRATORY_TRACT | Status: AC
  Administered 2015-06-05: 2.5 mg via RESPIRATORY_TRACT

## 2015-06-05 NOTE — Progress Notes (Signed)
06/05/2015 1:55 PM   DOB: 19-Mar-1981 / MRN: [id number]  SUBJECTIVE:  Lacey Franklin is a 35 y.o. female presenting for nasal congestion that started 4 days ago.  Associates sinus pressure, cough, sore throat. Has tried Mucinex without relief.  She has a history of asthma and has had flares in the past.    She is allergic to azithromycin.   She  has a past medical history of Allergy and Asthma.    She  reports that she has never smoked. She does not have any smokeless tobacco history on file. She reports that she drinks alcohol. She reports that she does not use illicit drugs. She  reports that she currently engages in sexual activity. She reports using the following method of birth control/protection: Pill. The patient  has no past surgical history on file.  Her family history includes Cancer in her father, maternal grandfather, maternal grandmother, and mother; Heart disease in her paternal grandfather and paternal grandmother; Hypertension in her paternal grandmother; Parkinson's disease in her maternal grandmother and paternal grandfather. There is no history of Heart attack or Stroke.  Review of Systems  Constitutional: Positive for malaise/fatigue. Negative for fever, chills and diaphoresis.  HENT: Positive for congestion and sore throat.   Respiratory: Positive for cough. Negative for hemoptysis, shortness of breath and wheezing.   Cardiovascular: Negative for chest pain.  Gastrointestinal: Negative for nausea.  Skin: Negative for rash.  Neurological: Negative for dizziness and weakness.  Endo/Heme/Allergies: Negative for polydipsia.    Problem list and medications reviewed and updated by myself where necessary, and exist elsewhere in the encounter.   OBJECTIVE:  BP 118/70 mmHg  Pulse 82  Temp(Src) 99 F (37.2 C) (Oral)  Resp 12  Ht 5' 7.5" (1.715 m)  Wt 162 lb (73.483 kg)  BMI 24.98 kg/m2  SpO2 96%  Physical Exam  Constitutional: She is oriented to person, place, and  time. She appears well-nourished. No distress.  Eyes: EOM are normal. Pupils are equal, round, and reactive to light.  Cardiovascular: Normal rate and regular rhythm.   Pulmonary/Chest: Effort normal and breath sounds normal.  Abdominal: She exhibits no distension.  Neurological: She is alert and oriented to person, place, and time. No cranial nerve deficit. Gait normal.  Skin: Skin is dry. She is not diaphoretic.  Psychiatric: She has a normal mood and affect.  Vitals reviewed.   Results for orders placed or performed in visit on 06/05/15 (from the past 72 hour(s))  POCT CBC     Status: Abnormal   Collection Time: 06/05/15  1:47 PM  Result Value Ref Range   WBC 8.6 4.6 - 10.2 K/uL   Lymph, poc 1.7 0.6 - 3.4   POC LYMPH PERCENT 19.9 10 - 50 %L   MID (cbc) 0.6 0 - 0.9   POC MID % 7.3 0 - 12 %M   POC Granulocyte 6.3 2 - 6.9   Granulocyte percent 72.8 37 - 80 %G   RBC 4.37 4.04 - 5.48 M/uL   Hemoglobin 14.0 12.2 - 16.2 g/dL   HCT, POC 39.9 37.7 - 47.9 %   MCV 91.3 80 - 97 fL   MCH, POC 31.9 (A) 27 - 31.2 pg   MCHC 35.0 31.8 - 35.4 g/dL   RDW, POC 12.1 %   Platelet Count, POC 232 142 - 424 K/uL   MPV 7.4 0 - 99.8 fL    No results found.  ASSESSMENT AND PLAN  Lacey Franklin was seen today for nasal  congestion, sinus pressure, cough and sore throat.  Diagnoses and all orders for this visit:  Cough:  Most likely viral and exam and CBC reassuring.  Will advise OTC therapies and her son has a nebulizer machine so I will write for some nebs and she can take the tubing from today's encounter.  Advised that if her breathing becomes tight she may need steroids.   -     albuterol (PROVENTIL) (2.5 MG/3ML) 0.083% nebulizer solution 2.5 mg; Take 3 mLs (2.5 mg total) by nebulization once. -     ipratropium (ATROVENT) nebulizer solution 0.5 mg; Take 2.5 mLs (0.5 mg total) by nebulization once.  History of asthma -     POCT CBC -     albuterol (PROVENTIL) (2.5 MG/3ML) 0.083% nebulizer solution 2.5  mg; Take 3 mLs (2.5 mg total) by nebulization once. -     ipratropium (ATROVENT) nebulizer solution 0.5 mg; Take 2.5 mLs (0.5 mg total) by nebulization once.   The patient was advised to call or return to clinic if she does not see an improvement in symptoms or to seek the care of the closest emergency department if she worsens with the above plan.   Philis Fendt, MHS, PA-C [facility] 06/05/2015 1:55 PM

## 2015-06-05 NOTE — Patient Instructions (Signed)
-   You have a viral upper respiratory infection, (the common cold) and it is not uncommon for symptoms to last 10 days to 2 weeks, regardless of which medications you take. Unfortunately antibiotics will not help your symptoms as they target bacteria, and you are likely suffering from a virus. Additionally, 1 in 8 people who take antibiotics suffer mild to severe side effects.    - You would benefit from high dose ibuprofen. TAKE 600-800 mg of ibuprofen every 8 hours as needed to control low grade fever, fatigue, and pain.    - I also recommend that you take Zyrtec-D 5-120 every morning or Claritin-D 10-240 for the next five to 10 days. This medication will help you with nasal congestion, post nasal drip and sneezing. You will have to purchase this directly from the pharmacist   Viral URI Medications: Please consult the pharmacist if you have questions. 600-800 mg ibuprofen every 8 hours as needed for the next five to ten days 5-120 Zyrtec-D every morning for five to 10 days OR Claritin D 10-240 every morning for five days.  Please visit the CDC's website [employer].cy to learn about appropriate and inappropriate uses of antibiotics.

## 2015-06-06 ENCOUNTER — Telehealth: Payer: Self-pay

## 2015-06-06 NOTE — Telephone Encounter (Addendum)
Pt stated she was given ALBUTEROL but was sure the Dr had given her 2 more medicines but the pharmacy only received the one. Please call [place]

## 2015-06-06 NOTE — Telephone Encounter (Signed)
According to AVS, other two meds are OTC, please call pt with this info. I have copied here:    - You would benefit from high dose ibuprofen. TAKE 600-800 mg of ibuprofen every 8 hours as needed to control low grade fever, fatigue, and pain.   - I also recommend that you take Zyrtec-D 5-120 every morning or Claritin-D 10-240 for the next five to 10 days. This medication will help you with nasal congestion, post nasal drip and sneezing. You will have to purchase this directly from the pharmacist

## 2015-06-07 NOTE — Telephone Encounter (Signed)
Lm message two medications were on AVS has any questions to call us back

## 2015-06-10 NOTE — Telephone Encounter (Signed)
Patient states she has thrust - [facility] [address]   Working at [employer] - caused by medication    [phone]

## 2015-06-14 NOTE — Telephone Encounter (Signed)
Called and lmom. Informed we did not prescribe anything that would cause thrush. If she is still having problems, she should come in to be seen.

## 2015-06-15 MED FILL — LO LOESTRIN FE 1-10 TABLET: 1 MG-10 MCG | 84 days supply | Qty: 84 | Fill #3

## 2015-06-17 ENCOUNTER — Ambulatory Visit (INDEPENDENT_AMBULATORY_CARE_PROVIDER_SITE_OTHER): Payer: 59 | Admitting: Family Medicine

## 2015-06-17 VITALS — BP 122/80 | HR 77 | Temp 98.8°F | Resp 16 | Ht 67.0 in | Wt 161.0 lb

## 2015-06-17 DIAGNOSIS — J0120 Acute ethmoidal sinusitis, unspecified: Secondary | ICD-10-CM

## 2015-06-17 DIAGNOSIS — K146 Glossodynia: Secondary | ICD-10-CM | POA: Diagnosis not present

## 2015-06-17 DIAGNOSIS — J029 Acute pharyngitis, unspecified: Secondary | ICD-10-CM | POA: Diagnosis not present

## 2015-06-17 DIAGNOSIS — R590 Localized enlarged lymph nodes: Secondary | ICD-10-CM

## 2015-06-17 LAB — POCT RAPID STREP A (OFFICE): Rapid Strep A Screen: NEGATIVE

## 2015-06-17 MED ORDER — AMOXICILLIN 875 MG PO TABS: 875.0000 mg | ORAL_TABLET | Freq: Two times a day (BID) | ORAL | Status: DC

## 2015-06-17 MED FILL — AMOXICILLIN 875 MG TABLET: 875 | 10 days supply | Qty: 20 | Fill #0

## 2015-06-17 NOTE — Patient Instructions (Signed)
Drink plenty of fluids and get enough rest  Amoxicillin 875 mg one twice daily  Return if worse or not improving

## 2015-06-17 NOTE — Progress Notes (Signed)
Patient ID: Lacey Franklin, female    DOB: 1981/01/13  Age: 35 y.o. MRN: [id number]  Chief Complaint  Patient presents with  . Cough    x 19 days  . Nasal Congestion  . Sore Throat    Subjective:   Patient continues to feel ill. She has sore throat and swollen glands in her neck. Her ears bothering her minimally. She has a little cough. She just noticed not get well. She works night shift doing the sleep studies.  Current allergies, medications, problem list, past/family and social histories reviewed.  Objective:  BP 122/80 mmHg  Pulse 77  Temp(Src) 98.8 F (37.1 C) (Oral)  Resp 16  Ht 5\' 7"  (1.702 m)  Wt 161 lb (73.029 kg)  BMI 25.21 kg/m2  SpO2 98%  LMP 06/17/2015  No major distress. TMs normal. Throat mild erythema. Neck supple with moderate cervical nodes. Chest clear. Heart regular without murmur.  Assessment & Plan:   Assessment: 1. Acute pharyngitis, unspecified etiology   2. Cervical lymphadenopathy   3. Tongue pain   4. Subacute ethmoidal sinusitis       Plan: Strep test be certain.  Orders Placed This Encounter  Procedures  . Culture, Group A Strep    Order Specific Question:  Source    Answer:  throat  . POCT rapid strep A    Meds ordered this encounter  Medications  . amoxicillin (AMOXIL) 875 MG tablet    Sig: Take 1 tablet (875 mg total) by mouth 2 (two) times daily.    Dispense:  20 tablet    Refill:  0    Results for orders placed or performed in visit on 06/17/15  POCT rapid strep A  Result Value Ref Range   Rapid Strep A Screen Negative Negative        Patient Instructions  Drink plenty of fluids and get enough rest  Amoxicillin 875 mg one twice daily  Return if worse or not improving     Return if symptoms worsen or fail to improve.   Tevin Shillingford, MD 06/17/2015

## 2015-06-18 LAB — CULTURE, GROUP A STREP: Organism ID, Bacteria: NORMAL

## 2015-06-21 ENCOUNTER — Encounter: Payer: Self-pay | Admitting: *Deleted

## 2015-06-29 DIAGNOSIS — S0181XA Laceration without foreign body of other part of head, initial encounter: Secondary | ICD-10-CM | POA: Diagnosis not present

## 2015-06-29 DIAGNOSIS — T148 Other injury of unspecified body region: Secondary | ICD-10-CM | POA: Diagnosis not present

## 2015-06-29 DIAGNOSIS — Z23 Encounter for immunization: Secondary | ICD-10-CM | POA: Diagnosis not present

## 2015-07-25 DIAGNOSIS — R3915 Urgency of urination: Secondary | ICD-10-CM | POA: Diagnosis not present

## 2015-07-29 ENCOUNTER — Other Ambulatory Visit (HOSPITAL_COMMUNITY): Payer: Self-pay | Admitting: Family Medicine

## 2015-07-29 ENCOUNTER — Other Ambulatory Visit: Payer: Self-pay | Admitting: Family Medicine

## 2015-07-29 DIAGNOSIS — R198 Other specified symptoms and signs involving the digestive system and abdomen: Secondary | ICD-10-CM

## 2015-07-29 DIAGNOSIS — R41840 Attention and concentration deficit: Secondary | ICD-10-CM | POA: Diagnosis not present

## 2015-07-29 DIAGNOSIS — R109 Unspecified abdominal pain: Secondary | ICD-10-CM | POA: Diagnosis not present

## 2015-07-29 DIAGNOSIS — R1084 Generalized abdominal pain: Secondary | ICD-10-CM

## 2015-07-29 DIAGNOSIS — R140 Abdominal distension (gaseous): Secondary | ICD-10-CM

## 2015-08-02 ENCOUNTER — Ambulatory Visit (HOSPITAL_COMMUNITY)
Admission: RE | Admit: 2015-08-02 | Discharge: 2015-08-02 | Disposition: A | Discharge status: Home / Self Care | Payer: 59 | Source: Ambulatory Visit | Attending: Family Medicine | Admitting: Family Medicine

## 2015-08-02 DIAGNOSIS — R109 Unspecified abdominal pain: Secondary | ICD-10-CM | POA: Insufficient documentation

## 2015-08-02 DIAGNOSIS — R198 Other specified symptoms and signs involving the digestive system and abdomen: Secondary | ICD-10-CM

## 2015-08-02 DIAGNOSIS — R102 Pelvic and perineal pain: Secondary | ICD-10-CM | POA: Diagnosis not present

## 2015-08-02 DIAGNOSIS — R140 Abdominal distension (gaseous): Secondary | ICD-10-CM

## 2015-08-15 DIAGNOSIS — Z6825 Body mass index (BMI) 25.0-25.9, adult: Secondary | ICD-10-CM | POA: Diagnosis not present

## 2015-08-15 DIAGNOSIS — Z01419 Encounter for gynecological examination (general) (routine) without abnormal findings: Secondary | ICD-10-CM | POA: Diagnosis not present

## 2015-10-19 MED FILL — RIZATRIPTAN 10 MG TABLET: 10 | 30 days supply | Qty: 9 | Fill #1

## 2016-01-10 DIAGNOSIS — L821 Other seborrheic keratosis: Secondary | ICD-10-CM | POA: Diagnosis not present

## 2016-01-10 DIAGNOSIS — D2261 Melanocytic nevi of right upper limb, including shoulder: Secondary | ICD-10-CM | POA: Diagnosis not present

## 2016-01-10 DIAGNOSIS — Z808 Family history of malignant neoplasm of other organs or systems: Secondary | ICD-10-CM | POA: Diagnosis not present

## 2016-01-10 DIAGNOSIS — D2262 Melanocytic nevi of left upper limb, including shoulder: Secondary | ICD-10-CM | POA: Diagnosis not present

## 2016-01-10 DIAGNOSIS — D280 Benign neoplasm of vulva: Secondary | ICD-10-CM | POA: Diagnosis not present

## 2016-01-10 DIAGNOSIS — D225 Melanocytic nevi of trunk: Secondary | ICD-10-CM | POA: Diagnosis not present

## 2016-01-10 DIAGNOSIS — L700 Acne vulgaris: Secondary | ICD-10-CM | POA: Diagnosis not present

## 2016-01-10 DIAGNOSIS — D2272 Melanocytic nevi of left lower limb, including hip: Secondary | ICD-10-CM | POA: Diagnosis not present

## 2016-01-10 DIAGNOSIS — Z86018 Personal history of other benign neoplasm: Secondary | ICD-10-CM | POA: Diagnosis not present

## 2016-01-24 DIAGNOSIS — M25511 Pain in right shoulder: Secondary | ICD-10-CM | POA: Diagnosis not present

## 2016-01-25 ENCOUNTER — Ambulatory Visit (HOSPITAL_COMMUNITY)
Admission: EM | Admit: 2016-01-25 | Discharge: 2016-01-25 | Disposition: A | Discharge status: Home / Self Care | Payer: 59 | Attending: Internal Medicine | Admitting: Internal Medicine

## 2016-01-25 ENCOUNTER — Encounter (HOSPITAL_COMMUNITY): Payer: Self-pay | Admitting: Emergency Medicine

## 2016-01-25 DIAGNOSIS — L259 Unspecified contact dermatitis, unspecified cause: Secondary | ICD-10-CM | POA: Diagnosis not present

## 2016-01-25 MED ORDER — METHYLPREDNISOLONE ACETATE 80 MG/ML IJ SUSP
80.0000 mg | Freq: Once | INTRAMUSCULAR | Status: AC
  Administered 2016-01-25: 80 mg via INTRAMUSCULAR

## 2016-01-25 MED ORDER — METHYLPREDNISOLONE ACETATE 80 MG/ML IJ SUSP
INTRAMUSCULAR | Status: AC
  Filled 2016-01-25: qty 1

## 2016-01-25 NOTE — ED Provider Notes (Signed)
CSN: [id number]     Arrival date & time 01/25/16  1326 History   First MD Initiated Contact with Patient 01/25/16 1434     Chief Complaint  Patient presents with  . Rash   (Consider location/radiation/quality/duration/timing/severity/associated sxs/prior Treatment) HPI 35 y/o f with rash anterior chest since yesterday, awoke this morning and rash worse. Used cortisone cream which caused burning, and being in the sun caused rash to hurt. No other treatment, was wearing a new necklace yesterday in the area of the rash. No sob. Does have itchy chest. Past Medical History:  Diagnosis Date  . Allergy   . Asthma    History reviewed. No pertinent surgical history. Family History  Problem Relation Age of Onset  . Cancer Mother   . Cancer Father   . Cancer Maternal Grandmother   . Parkinson's disease Maternal Grandmother   . Cancer Maternal Grandfather   . Heart disease Paternal Grandmother   . Hypertension Paternal Grandmother   . Heart disease Paternal Grandfather   . Parkinson's disease Paternal Grandfather   . Heart attack Neg Hx   . Stroke Neg Hx    Social History  Substance Use Topics  . Smoking status: Never Smoker  . Smokeless tobacco: Never Used  . Alcohol use 0.0 oz/week   OB History    No data available     Review of Systems  Denies: HEADACHE, NAUSEA, ABDOMINAL PAIN, CHEST PAIN, CONGESTION, DYSURIA, SHORTNESS OF BREATH  Allergies  Azithromycin  Home Medications   Prior to Admission medications   Medication Sig Start Date End Date Taking? Authorizing Provider  albuterol (PROVENTIL) (2.5 MG/3ML) 0.083% nebulizer solution Take 3 mLs (2.5 mg total) by nebulization every 6 (six) hours as needed for wheezing or shortness of breath. 06/05/15   Tereasa Coop, PA-C  ALPRAZolam Duanne Moron) 0.25 MG tablet Take 1/2-1 pill only if needed for anxiety or panic attack. 03/15/15   Posey Boyer, MD  amoxicillin (AMOXIL) 875 MG tablet Take 1 tablet (875 mg total) by mouth 2 (two)  times daily. 06/17/15   Posey Boyer, MD  CALCIUM-VITAMIN D PO Take 1 tablet by mouth daily.    Historical Provider, MD  ibuprofen (ADVIL,MOTRIN) 200 MG tablet Take 800 mg by mouth every 6 (six) hours as needed for moderate pain.     Historical Provider, MD  LO LOESTRIN FE 1 MG-10 MCG / 10 MCG tablet Take 1 tablet by mouth daily.  03/28/15   Historical Provider, MD  loratadine (CLARITIN) 10 MG tablet Take 10 mg by mouth daily.    Historical Provider, MD  Magnesium 250 MG TABS Take 250 mg by mouth 4 (four) times a week.    Historical Provider, MD  Multiple Vitamin (MULTIVITAMIN WITH MINERALS) TABS tablet Take 1 tablet by mouth daily.    Historical Provider, MD  Probiotic Product (PROBIOTIC DAILY) CAPS Take 1 capsule by mouth daily.    Historical Provider, MD  rizatriptan (MAXALT-MLT) 10 MG disintegrating tablet Take 10 mg by mouth as needed for migraine. May repeat in 2 hours if needed    Historical Provider, MD   Meds Ordered and Administered this Visit   Medications  methylPREDNISolone acetate (DEPO-MEDROL) injection 80 mg (not administered)    BP 146/95 (BP Location: Right Arm)   Pulse 70   Temp 98.7 F (37.1 C) (Oral)   Resp 18   LMP 01/20/2016 (Exact Date)   SpO2 100%  No data found.   Physical Exam NURSES NOTES AND VITAL SIGNS  REVIEWED. CONSTITUTIONAL: Well developed, well nourished, no acute distress HEENT: normocephalic, atraumatic EYES: Conjunctiva normal NECK:normal ROM, supple, no adenopathy PULMONARY:No respiratory distress, normal effort ABDOMINAL: Soft, ND, NT BS+, No CVAT MUSCULOSKELETAL: Normal ROM of all extremities,  SKIN: warm and dry, rash consistent with a necklace dermatitis. No other rash on skin.  PSYCHIATRIC: Mood and affect, behavior are normal  Urgent Care Course   Clinical Course    Procedures (including critical care time)  Labs Review Labs Reviewed - No data to display  Imaging Review No results found.   Visual Acuity Review  Right  Eye Distance:   Left Eye Distance:   Bilateral Distance:    Right Eye Near:   Left Eye Near:    Bilateral Near:         MDM   1. Contact dermatitis     Patient is reassured that there are no issues that require transfer to higher level of care at this time or additional tests. Patient is advised to continue home symptomatic treatment. Patient is advised that if there are new or worsening symptoms to attend the emergency department, contact primary care provider, or return to UC. Instructions of care provided discharged home in stable condition.    THIS NOTE WAS GENERATED USING A VOICE RECOGNITION SOFTWARE PROGRAM. ALL REASONABLE EFFORTS  WERE MADE TO PROOFREAD THIS DOCUMENT FOR ACCURACY.  I have verbally reviewed the discharge instructions with the patient. A printed AVS was given to the patient.  All questions were answered prior to discharge.      Konrad Felix, PA 01/25/16 [phone]

## 2016-01-25 NOTE — ED Triage Notes (Signed)
The patient presented to the [facility] with a complaint of a rash on her chest that started last night. The patient stated that she had a pumpkin muffin yesterday and thinks it may be a gluten allergy.

## 2016-01-25 NOTE — Discharge Instructions (Signed)
Steroids should give you about 3 days of relief.

## 2016-01-27 DIAGNOSIS — M25511 Pain in right shoulder: Secondary | ICD-10-CM | POA: Diagnosis not present

## 2016-01-30 MED FILL — PROMETHAZINE 25 MG TABLET: 25 | 10 days supply | Qty: 20 | Fill #0

## 2016-01-30 MED FILL — RIZATRIPTAN 10 MG TABLET: 10 | 30 days supply | Qty: 9 | Fill #2

## 2016-02-02 DIAGNOSIS — M25511 Pain in right shoulder: Secondary | ICD-10-CM | POA: Diagnosis not present

## 2016-02-07 DIAGNOSIS — M25511 Pain in right shoulder: Secondary | ICD-10-CM | POA: Diagnosis not present

## 2016-03-29 MED FILL — RIZATRIPTAN 10 MG TABLET: 10 | 30 days supply | Qty: 9 | Fill #3

## 2016-04-04 ENCOUNTER — Ambulatory Visit (INDEPENDENT_AMBULATORY_CARE_PROVIDER_SITE_OTHER): Payer: 59 | Admitting: Family Medicine

## 2016-04-04 ENCOUNTER — Encounter: Payer: Self-pay | Admitting: Family Medicine

## 2016-04-04 VITALS — BP 136/64 | HR 73 | Temp 98.5°F | Resp 16 | Ht 67.5 in | Wt 162.2 lb

## 2016-04-04 DIAGNOSIS — J45990 Exercise induced bronchospasm: Secondary | ICD-10-CM | POA: Diagnosis not present

## 2016-04-04 DIAGNOSIS — J0100 Acute maxillary sinusitis, unspecified: Secondary | ICD-10-CM | POA: Diagnosis not present

## 2016-04-04 DIAGNOSIS — J069 Acute upper respiratory infection, unspecified: Secondary | ICD-10-CM

## 2016-04-04 MED ORDER — AMOXICILLIN-POT CLAVULANATE 875-125 MG PO TABS: 1.0000 | ORAL_TABLET | Freq: Two times a day (BID) | ORAL | 0 refills | Status: DC

## 2016-04-04 MED ORDER — ALBUTEROL SULFATE 108 (90 BASE) MCG/ACT IN AEPB: 1.0000 | INHALATION_SPRAY | RESPIRATORY_TRACT | 0 refills | Status: DC | PRN

## 2016-04-04 MED FILL — AMOX-CLAV 875-125 MG TABLET: 875-125 | 10 days supply | Qty: 20 | Fill #0

## 2016-04-04 MED FILL — PROAIR RESPICLICK INHAL PWD: 108 (90 BAS | 25 days supply | Qty: 1 | Fill #0

## 2016-04-04 NOTE — Progress Notes (Signed)
By signing my name below I, Tereasa Coop, attest that this documentation has been prepared under the direction and in the presence of Wendie Agreste, MD. Electonically Signed. Tereasa Coop, Scribe 04/04/2016 at 3:41 PM  Subjective:    Patient ID: Lacey Franklin, female    DOB: [birth year], 35 y.o.   MRN: [id number]  Chief Complaint  Patient presents with  . Sore Throat    Sinus drainage  . Facial Pain    on Lt side of face and Lt Ear  . Cough     x 2 days    HPI Kadijatu Frizzell is a 35 y.o. female who presents to the [facility] complaining of sore throat and cough for the past 5 days. Also c/o sinus HA, left ear pain, sinus congestion, and left sided facial pain that have developed in the past 4 days. Pt also has white to yellow nasal discharge. Pt's sinus pain and nasal congestion has started to worsen in the past 2 days. Pt has been taking decongestants.   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 Pt states she worked with person who had the flu 4 days ago. Pt's boss also has had a cough and cold recently.   Pt states she had her influenza immunization this year.   Pt reports that she vomits within 30 minutes of taking azithromycin.   Pt has history of exercise induced asthma. Pt has an albuterol inhaler at home that she thinks is expired.   There are no active problems to display for this patient.  Past Medical History:  Diagnosis Date  . Allergy   . Asthma    No past surgical history on file. Allergies  Allergen Reactions  . Azithromycin Other (See Comments)   Prior to Admission medications   Medication Sig Start Date End Date Taking? Authorizing Provider  CALCIUM-VITAMIN D PO Take 1 tablet by mouth daily.   Yes Historical Provider, MD  ibuprofen (ADVIL,MOTRIN) 200 MG tablet Take 800 mg by mouth every 6 (six) hours as needed for moderate pain.    Yes Historical Provider, MD  loratadine (CLARITIN) 10 MG tablet Take 10 mg by mouth daily.    Yes Historical Provider, MD  Magnesium 250 MG TABS Take 250 mg by mouth 4 (four) times a week.   Yes Historical Provider, MD  Multiple Vitamin (MULTIVITAMIN WITH MINERALS) TABS tablet Take 1 tablet by mouth daily.   Yes Historical Provider, MD  Probiotic Product (PROBIOTIC DAILY) CAPS Take 1 capsule by mouth daily.   Yes Historical Provider, MD  rizatriptan (MAXALT-MLT) 10 MG disintegrating tablet Take 10 mg by mouth as needed for migraine. May repeat in 2 hours if needed   Yes Historical Provider, MD  albuterol (PROVENTIL) (2.5 MG/3ML) 0.083% nebulizer solution Take 3 mLs (2.5 mg total) by nebulization every 6 (six) hours as needed for wheezing or shortness of breath. Patient not taking: Reported on 04/04/2016 06/05/15   Tereasa Coop, PA-C  ALPRAZolam Duanne Moron) 0.25 MG tablet Take 1/2-1 pill only if needed for anxiety or panic attack. Patient not taking: Reported on 04/04/2016 03/15/15   Posey Boyer, MD   Social History   Social History  . Marital status: Married    Spouse name: N/A  . Number of children: N/A  . Years of education: N/A   Occupational History  . Not on file.   Social History Main Topics  . Smoking status: Never Smoker  . Smokeless tobacco: Never Used  .  Alcohol use 0.0 oz/week  . Drug use: No  . Sexual activity: Yes    Birth control/ protection: Pill   Other Topics Concern  . Not on file   Social History Narrative  . No narrative on file      Review of Systems  Constitutional: Negative for fever.  HENT: Positive for congestion, sinus pressure and sore throat.   Respiratory: Positive for cough.   Neurological: Positive for headaches.       Objective:   Physical Exam  Constitutional: She is oriented to person, place, and time. She appears well-developed and well-nourished. No distress.  HENT:  Head: Normocephalic and atraumatic.  Right Ear: Hearing, tympanic membrane, external ear and ear canal normal.  Left Ear: Hearing, external ear and ear canal  normal. Tympanic membrane is not erythematous.  Nose: Right sinus exhibits frontal sinus tenderness. Left sinus exhibits maxillary sinus tenderness and frontal sinus tenderness.  Mouth/Throat: Oropharynx is clear and moist. No oropharyngeal exudate or posterior oropharyngeal erythema.  Post nasal drip.  Pt has mild amount of fluid build up in left TM.   Eyes: Conjunctivae and EOM are normal. Pupils are equal, round, and reactive to light.  Cardiovascular: Normal rate, regular rhythm, normal heart sounds and intact distal pulses.   No murmur heard. Pulmonary/Chest: Effort normal and breath sounds normal. No respiratory distress. She has no wheezes. She has no rhonchi.  Lymphadenopathy:    She has no cervical adenopathy.  Neurological: She is alert and oriented to person, place, and time.  Skin: Skin is warm and dry. No rash noted.  Psychiatric: She has a normal mood and affect. Her behavior is normal.  Vitals reviewed.   Vitals:   04/04/16 1453  BP: 136/64  Pulse: 73  Resp: 16  Temp: 98.5 F (36.9 C)  TempSrc: Oral  SpO2: 99%  Weight: 162 lb 3.2 oz (73.6 kg)  Height: 5' 7.5" (1.715 m)         Assessment & Plan:    Airica Fazzolari is a 35 y.o. female Acute upper respiratory infection  Acute maxillary sinusitis, recurrence not specified - Plan: amoxicillin-clavulanate (AUGMENTIN) 875-125 MG tablet  Exercise-induced bronchospasm - Plan: Albuterol Sulfate (PROAIR RESPICLICK) 123XX123 (90 Base) MCG/ACT AEPB  Upper respiratory infection, initially viral sounding, with worsening sinus symptoms. Possible early maxillary sinusitis with serous otitis as cause of left ear pain. History of exercise-induced bronchospasm/asthma, but no wheeze or abnormal lung sounds on exam.  -Symptomatic care discussed with over the counter Mucinex, saline nasal spray, Sudafed. Augmentin prescription given if not improving in the next 24-48 hrs., RTC precautions discussed.  -New prescription for pro-air  given if wheezing.  -Activity modification discussed, and if persistent chest symptoms would avoid running for now.  Meds ordered this encounter  Medications  . amoxicillin-clavulanate (AUGMENTIN) 875-125 MG tablet    Sig: Take 1 tablet by mouth 2 (two) times daily.    Dispense:  20 tablet    Refill:  0  . Albuterol Sulfate (PROAIR RESPICLICK) 123XX123 (90 Base) MCG/ACT AEPB    Sig: Inhale 1 Inhaler into the lungs every 4 (four) hours as needed.    Dispense:  1 each    Refill:  0   Patient Instructions     Continue Sudafed, Mucinex, drink plenty of fluids, and saline nasal spray or neti pot. Antibiotic Augmentin was printed if not improving within the next day or 2. If needed for wheezing, proair was sent to the pharmacy. Tylenol or advil if needed  for face and ear pain. Ear pain likely related to sinus pressure/congestion.   Return to the clinic or go to the nearest emergency room if any of your symptoms worsen or new symptoms occur.   Sinusitis, Adult Sinusitis is soreness and inflammation of your sinuses. Sinuses are hollow spaces in the bones around your face. Your sinuses are located:  Around your eyes.  In the middle of your forehead.  Behind your nose.  In your cheekbones. Your sinuses and nasal passages are lined with a stringy fluid (mucus). Mucus normally drains out of your sinuses. When your nasal tissues become inflamed or swollen, the mucus can become trapped or blocked so air cannot flow through your sinuses. This allows bacteria, viruses, and funguses to grow, which leads to infection. Sinusitis can develop quickly and last for 7?10 days (acute) or for more than 12 weeks (chronic). Sinusitis often develops after a cold. What are the causes? This condition is caused by anything that creates swelling in the sinuses or stops mucus from draining, including:  Allergies.  Asthma.  Bacterial or viral infection.  Abnormally shaped bones between the nasal  passages.  Nasal growths that contain mucus (nasal polyps).  Narrow sinus openings.  Pollutants, such as chemicals or irritants in the air.  A foreign object stuck in the nose.  A fungal infection. This is rare. What increases the risk? The following factors may make you more likely to develop this condition:  Having allergies or asthma.  Having had a recent cold or respiratory tract infection.  Having structural deformities or blockages in your nose or sinuses.  Having a weak immune system.  Doing a lot of swimming or diving.  Overusing nasal sprays.  Smoking. What are the signs or symptoms? The main symptoms of this condition are pain and a feeling of pressure around the affected sinuses. Other symptoms include:  Upper toothache.  Earache.  Headache.  Bad breath.  Decreased sense of smell and taste.  A cough that may get worse at night.  Fatigue.  Fever.  Thick drainage from your nose. The drainage is often green and it may contain pus (purulent).  Stuffy nose or congestion.  Postnasal drip. This is when extra mucus collects in the throat or back of the nose.  Swelling and warmth over the affected sinuses.  Sore throat.  Sensitivity to light. How is this diagnosed? This condition is diagnosed based on symptoms, a medical history, and a physical exam. To find out if your condition is acute or chronic, your health care provider may:  Look in your nose for signs of nasal polyps.  Tap over the affected sinus to check for signs of infection.  View the inside of your sinuses using an imaging device that has a light attached (endoscope). If your health care provider suspects that you have chronic sinusitis, you may also:  Be tested for allergies.  Have a sample of mucus taken from your nose (nasal culture) and checked for bacteria.  Have a mucus sample examined to see if your sinusitis is related to an allergy. If your sinusitis does not respond to  treatment and it lasts longer than 8 weeks, you may have an MRI or CT scan to check your sinuses. These scans also help to determine how severe your infection is. In rare cases, a bone biopsy may be done to rule out more serious types of fungal sinus disease. How is this treated? Treatment for sinusitis depends on the cause and whether your  condition is chronic or acute. If a virus is causing your sinusitis, your symptoms will go away on their own within 10 days. You may be given medicines to relieve your symptoms, including:  Topical nasal decongestants. They shrink swollen nasal passages and let mucus drain from your sinuses.  Antihistamines. These drugs block inflammation that is triggered by allergies. This can help to ease swelling in your nose and sinuses.  Topical nasal corticosteroids. These are nasal sprays that ease inflammation and swelling in your nose and sinuses.  Nasal saline washes. These rinses can help to get rid of thick mucus in your nose. If your condition is caused by bacteria, you will be given an antibiotic medicine. If your condition is caused by a fungus, you will be given an antifungal medicine. Surgery may be needed to correct underlying conditions, such as narrow nasal passages. Surgery may also be needed to remove polyps. Follow these instructions at home: Medicines  Take, use, or apply over-the-counter and prescription medicines only as told by your health care provider. These may include nasal sprays.  If you were prescribed an antibiotic medicine, take it as told by your health care provider. Do not stop taking the antibiotic even if you start to feel better. Hydrate and Humidify  Drink enough water to keep your urine clear or pale yellow. Staying hydrated will help to thin your mucus.  Use a cool mist humidifier to keep the humidity level in your home above 50%.  Inhale steam for 10-15 minutes, 3-4 times a day or as told by your health care provider. You  can do this in the bathroom while a hot shower is running.  Limit your exposure to cool or dry air. Rest  Rest as much as possible.  Sleep with your head raised (elevated).  Make sure to get enough sleep each night. General instructions  Apply a warm, moist washcloth to your face 3-4 times a day or as told by your health care provider. This will help with discomfort.  Wash your hands often with soap and water to reduce your exposure to viruses and other germs. If soap and water are not available, use hand sanitizer.  Do not smoke. Avoid being around people who are smoking (secondhand smoke).  Keep all follow-up visits as told by your health care provider. This is important. Contact a health care provider if:  You have a fever.  Your symptoms get worse.  Your symptoms do not improve within 10 days. Get help right away if:  You have a severe headache.  You have persistent vomiting.  You have pain or swelling around your face or eyes.  You have vision problems.  You develop confusion.  Your neck is stiff.  You have trouble breathing. This information is not intended to replace advice given to you by your health care provider. Make sure you discuss any questions you have with your health care provider. Document Released: 04/16/2005 Document Revised: 12/11/2015 Document Reviewed: 02/09/2015 Elsevier Interactive Patient Education  2017 [place].  Upper Respiratory Infection, Adult Most upper respiratory infections (URIs) are a viral infection of the air passages leading to the lungs. A URI affects the nose, throat, and upper air passages. The most common type of URI is nasopharyngitis and is typically referred to as "the common cold." URIs run their course and usually go away on their own. Most of the time, a URI does not require medical attention, but sometimes a bacterial infection in the upper airways can follow  a viral infection. This is called a secondary infection.  Sinus and middle ear infections are common types of secondary upper respiratory infections. Bacterial pneumonia can also complicate a URI. A URI can worsen asthma and chronic obstructive pulmonary disease (COPD). Sometimes, these complications can require emergency medical care and may be life threatening. What are the causes? Almost all URIs are caused by viruses. A virus is a type of germ and can spread from one person to another. What increases the risk? You may be at risk for a URI if:  You smoke.  You have chronic heart or lung disease.  You have a weakened defense (immune) system.  You are very young or very old.  You have nasal allergies or asthma.  You work in crowded or poorly ventilated areas.  You work in health care facilities or schools. What are the signs or symptoms? Symptoms typically develop 2-3 days after you come in contact with a cold virus. Most viral URIs last 7-10 days. However, viral URIs from the influenza virus (flu virus) can last 14-18 days and are typically more severe. Symptoms may include:  Runny or stuffy (congested) nose.  Sneezing.  Cough.  Sore throat.  Headache.  Fatigue.  Fever.  Loss of appetite.  Pain in your forehead, behind your eyes, and over your cheekbones (sinus pain).  Muscle aches. How is this diagnosed? Your health care provider may diagnose a URI by:  Physical exam.  Tests to check that your symptoms are not due to another condition such as:  Strep throat.  Sinusitis.  Pneumonia.  Asthma. How is this treated? A URI goes away on its own with time. It cannot be cured with medicines, but medicines may be prescribed or recommended to relieve symptoms. Medicines may help:  Reduce your fever.  Reduce your cough.  Relieve nasal congestion. Follow these instructions at home:  Take medicines only as directed by your health care provider.  Gargle warm saltwater or take cough drops to comfort your throat as  directed by your health care provider.  Use a warm mist humidifier or inhale steam from a shower to increase air moisture. This may make it easier to breathe.  Drink enough fluid to keep your urine clear or pale yellow.  Eat soups and other clear broths and maintain good nutrition.  Rest as needed.  Return to work when your temperature has returned to normal or as your health care provider advises. You may need to stay home longer to avoid infecting others. You can also use a face mask and careful hand washing to prevent spread of the virus.  Increase the usage of your inhaler if you have asthma.  Do not use any tobacco products, including cigarettes, chewing tobacco, or electronic cigarettes. If you need help quitting, ask your health care provider. How is this prevented? The best way to protect yourself from getting a cold is to practice good hygiene.  Avoid oral or hand contact with people with cold symptoms.  Wash your hands often if contact occurs. There is no clear evidence that vitamin C, vitamin E, echinacea, or exercise reduces the chance of developing a cold. However, it is always recommended to get plenty of rest, exercise, and practice good nutrition. Contact a health care provider if:  You are getting worse rather than better.  Your symptoms are not controlled by medicine.  You have chills.  You have worsening shortness of breath.  You have brown or red mucus.  You have yellow  or brown nasal discharge.  You have pain in your face, especially when you bend forward.  You have a fever.  You have swollen neck glands.  You have pain while swallowing.  You have white areas in the back of your throat. Get help right away if:  You have severe or persistent:  Headache.  Ear pain.  Sinus pain.  Chest pain.  You have chronic lung disease and any of the following:  Wheezing.  Prolonged cough.  Coughing up blood.  A change in your usual mucus.  You  have a stiff neck.  You have changes in your:  Vision.  Hearing.  Thinking.  Mood. This information is not intended to replace advice given to you by your health care provider. Make sure you discuss any questions you have with your health care provider. Document Released: 10/10/2000 Document Revised: 12/18/2015 Document Reviewed: 07/22/2013 Elsevier Interactive Patient Education  2017 [employer].   IF you received an x-ray today, you will receive an invoice from [facility]. Please contact [facility] at [phone] with questions or concerns regarding your invoice.   IF you received labwork today, you will receive an invoice from [employer]. Please contact [facility] at [phone] with questions or concerns regarding your invoice.   Our billing staff will not be able to assist you with questions regarding bills from these companies.  You will be contacted with the lab results as soon as they are available. The fastest way to get your results is to activate your My Chart account. Instructions are located on the last page of this paperwork. If you have not heard from [place] regarding the results in 2 weeks, please contact this office.        I personally performed the services described in this documentation, which was scribed in my presence. The recorded information has been reviewed and considered, and addended by me as needed.   Signed,   Merri Ray, MD [facility].  04/04/16 3:50 PM

## 2016-04-04 NOTE — Patient Instructions (Addendum)
Continue Sudafed, Mucinex, drink plenty of fluids, and saline nasal spray or neti pot. Antibiotic Augmentin was printed if not improving within the next day or 2. If needed for wheezing, proair was sent to the pharmacy. Tylenol or advil if needed for face and ear pain. Ear pain likely related to sinus pressure/congestion.   Return to the clinic or go to the nearest emergency room if any of your symptoms worsen or new symptoms occur.   Sinusitis, Adult Sinusitis is soreness and inflammation of your sinuses. Sinuses are hollow spaces in the bones around your face. Your sinuses are located:  Around your eyes.  In the middle of your forehead.  Behind your nose.  In your cheekbones. Your sinuses and nasal passages are lined with a stringy fluid (mucus). Mucus normally drains out of your sinuses. When your nasal tissues become inflamed or swollen, the mucus can become trapped or blocked so air cannot flow through your sinuses. This allows bacteria, viruses, and funguses to grow, which leads to infection. Sinusitis can develop quickly and last for 7?10 days (acute) or for more than 12 weeks (chronic). Sinusitis often develops after a cold. What are the causes? This condition is caused by anything that creates swelling in the sinuses or stops mucus from draining, including:  Allergies.  Asthma.  Bacterial or viral infection.  Abnormally shaped bones between the nasal passages.  Nasal growths that contain mucus (nasal polyps).  Narrow sinus openings.  Pollutants, such as chemicals or irritants in the air.  A foreign object stuck in the nose.  A fungal infection. This is rare. What increases the risk? The following factors may make you more likely to develop this condition:  Having allergies or asthma.  Having had a recent cold or respiratory tract infection.  Having structural deformities or blockages in your nose or sinuses.  Having a weak immune system.  Doing a lot of  swimming or diving.  Overusing nasal sprays.  Smoking. What are the signs or symptoms? The main symptoms of this condition are pain and a feeling of pressure around the affected sinuses. Other symptoms include:  Upper toothache.  Earache.  Headache.  Bad breath.  Decreased sense of smell and taste.  A cough that may get worse at night.  Fatigue.  Fever.  Thick drainage from your nose. The drainage is often green and it may contain pus (purulent).  Stuffy nose or congestion.  Postnasal drip. This is when extra mucus collects in the throat or back of the nose.  Swelling and warmth over the affected sinuses.  Sore throat.  Sensitivity to light. How is this diagnosed? This condition is diagnosed based on symptoms, a medical history, and a physical exam. To find out if your condition is acute or chronic, your health care provider may:  Look in your nose for signs of nasal polyps.  Tap over the affected sinus to check for signs of infection.  View the inside of your sinuses using an imaging device that has a light attached (endoscope). If your health care provider suspects that you have chronic sinusitis, you may also:  Be tested for allergies.  Have a sample of mucus taken from your nose (nasal culture) and checked for bacteria.  Have a mucus sample examined to see if your sinusitis is related to an allergy. If your sinusitis does not respond to treatment and it lasts longer than 8 weeks, you may have an MRI or CT scan to check your sinuses. These scans also  help to determine how severe your infection is. In rare cases, a bone biopsy may be done to rule out more serious types of fungal sinus disease. How is this treated? Treatment for sinusitis depends on the cause and whether your condition is chronic or acute. If a virus is causing your sinusitis, your symptoms will go away on their own within 10 days. You may be given medicines to relieve your symptoms,  including:  Topical nasal decongestants. They shrink swollen nasal passages and let mucus drain from your sinuses.  Antihistamines. These drugs block inflammation that is triggered by allergies. This can help to ease swelling in your nose and sinuses.  Topical nasal corticosteroids. These are nasal sprays that ease inflammation and swelling in your nose and sinuses.  Nasal saline washes. These rinses can help to get rid of thick mucus in your nose. If your condition is caused by bacteria, you will be given an antibiotic medicine. If your condition is caused by a fungus, you will be given an antifungal medicine. Surgery may be needed to correct underlying conditions, such as narrow nasal passages. Surgery may also be needed to remove polyps. Follow these instructions at home: Medicines  Take, use, or apply over-the-counter and prescription medicines only as told by your health care provider. These may include nasal sprays.  If you were prescribed an antibiotic medicine, take it as told by your health care provider. Do not stop taking the antibiotic even if you start to feel better. Hydrate and Humidify  Drink enough water to keep your urine clear or pale yellow. Staying hydrated will help to thin your mucus.  Use a cool mist humidifier to keep the humidity level in your home above 50%.  Inhale steam for 10-15 minutes, 3-4 times a day or as told by your health care provider. You can do this in the bathroom while a hot shower is running.  Limit your exposure to cool or dry air. Rest  Rest as much as possible.  Sleep with your head raised (elevated).  Make sure to get enough sleep each night. General instructions  Apply a warm, moist washcloth to your face 3-4 times a day or as told by your health care provider. This will help with discomfort.  Wash your hands often with soap and water to reduce your exposure to viruses and other germs. If soap and water are not available, use hand  sanitizer.  Do not smoke. Avoid being around people who are smoking (secondhand smoke).  Keep all follow-up visits as told by your health care provider. This is important. Contact a health care provider if:  You have a fever.  Your symptoms get worse.  Your symptoms do not improve within 10 days. Get help right away if:  You have a severe headache.  You have persistent vomiting.  You have pain or swelling around your face or eyes.  You have vision problems.  You develop confusion.  Your neck is stiff.  You have trouble breathing. This information is not intended to replace advice given to you by your health care provider. Make sure you discuss any questions you have with your health care provider. Document Released: 04/16/2005 Document Revised: 12/11/2015 Document Reviewed: 02/09/2015 Elsevier Interactive Patient Education  2017 [place].  Upper Respiratory Infection, Adult Most upper respiratory infections (URIs) are a viral infection of the air passages leading to the lungs. A URI affects the nose, throat, and upper air passages. The most common type of URI is nasopharyngitis  and is typically referred to as "the common cold." URIs run their course and usually go away on their own. Most of the time, a URI does not require medical attention, but sometimes a bacterial infection in the upper airways can follow a viral infection. This is called a secondary infection. Sinus and middle ear infections are common types of secondary upper respiratory infections. Bacterial pneumonia can also complicate a URI. A URI can worsen asthma and chronic obstructive pulmonary disease (COPD). Sometimes, these complications can require emergency medical care and may be life threatening. What are the causes? Almost all URIs are caused by viruses. A virus is a type of germ and can spread from one person to another. What increases the risk? You may be at risk for a URI if:  You smoke.  You  have chronic heart or lung disease.  You have a weakened defense (immune) system.  You are very young or very old.  You have nasal allergies or asthma.  You work in crowded or poorly ventilated areas.  You work in health care facilities or schools. What are the signs or symptoms? Symptoms typically develop 2-3 days after you come in contact with a cold virus. Most viral URIs last 7-10 days. However, viral URIs from the influenza virus (flu virus) can last 14-18 days and are typically more severe. Symptoms may include:  Runny or stuffy (congested) nose.  Sneezing.  Cough.  Sore throat.  Headache.  Fatigue.  Fever.  Loss of appetite.  Pain in your forehead, behind your eyes, and over your cheekbones (sinus pain).  Muscle aches. How is this diagnosed? Your health care provider may diagnose a URI by:  Physical exam.  Tests to check that your symptoms are not due to another condition such as:  Strep throat.  Sinusitis.  Pneumonia.  Asthma. How is this treated? A URI goes away on its own with time. It cannot be cured with medicines, but medicines may be prescribed or recommended to relieve symptoms. Medicines may help:  Reduce your fever.  Reduce your cough.  Relieve nasal congestion. Follow these instructions at home:  Take medicines only as directed by your health care provider.  Gargle warm saltwater or take cough drops to comfort your throat as directed by your health care provider.  Use a warm mist humidifier or inhale steam from a shower to increase air moisture. This may make it easier to breathe.  Drink enough fluid to keep your urine clear or pale yellow.  Eat soups and other clear broths and maintain good nutrition.  Rest as needed.  Return to work when your temperature has returned to normal or as your health care provider advises. You may need to stay home longer to avoid infecting others. You can also use a face mask and careful hand washing  to prevent spread of the virus.  Increase the usage of your inhaler if you have asthma.  Do not use any tobacco products, including cigarettes, chewing tobacco, or electronic cigarettes. If you need help quitting, ask your health care provider. How is this prevented? The best way to protect yourself from getting a cold is to practice good hygiene.  Avoid oral or hand contact with people with cold symptoms.  Wash your hands often if contact occurs. There is no clear evidence that vitamin C, vitamin E, echinacea, or exercise reduces the chance of developing a cold. However, it is always recommended to get plenty of rest, exercise, and practice good nutrition. Contact a  health care provider if:  You are getting worse rather than better.  Your symptoms are not controlled by medicine.  You have chills.  You have worsening shortness of breath.  You have brown or red mucus.  You have yellow or brown nasal discharge.  You have pain in your face, especially when you bend forward.  You have a fever.  You have swollen neck glands.  You have pain while swallowing.  You have white areas in the back of your throat. Get help right away if:  You have severe or persistent:  Headache.  Ear pain.  Sinus pain.  Chest pain.  You have chronic lung disease and any of the following:  Wheezing.  Prolonged cough.  Coughing up blood.  A change in your usual mucus.  You have a stiff neck.  You have changes in your:  Vision.  Hearing.  Thinking.  Mood. This information is not intended to replace advice given to you by your health care provider. Make sure you discuss any questions you have with your health care provider. Document Released: 10/10/2000 Document Revised: 12/18/2015 Document Reviewed: 07/22/2013 Elsevier Interactive Patient Education  2017 [employer].   IF you received an x-ray today, you will receive an invoice from [facility]. Please contact  [facility] at [phone] with questions or concerns regarding your invoice.   IF you received labwork today, you will receive an invoice from [employer]. Please contact [facility] at [phone] with questions or concerns regarding your invoice.   Our billing staff will not be able to assist you with questions regarding bills from these companies.  You will be contacted with the lab results as soon as they are available. The fastest way to get your results is to activate your My Chart account. Instructions are located on the last page of this paperwork. If you have not heard from [place] regarding the results in 2 weeks, please contact this office.

## 2016-04-05 DIAGNOSIS — H5201 Hypermetropia, right eye: Secondary | ICD-10-CM | POA: Diagnosis not present

## 2016-04-05 DIAGNOSIS — H52223 Regular astigmatism, bilateral: Secondary | ICD-10-CM | POA: Diagnosis not present

## 2017-03-01 LAB — OB RESULTS CONSOLE ABO/RH: RH TYPE: NEGATIVE

## 2017-03-01 LAB — OB RESULTS CONSOLE ANTIBODY SCREEN: ANTIBODY SCREEN: NEGATIVE

## 2017-03-01 LAB — OB RESULTS CONSOLE GC/CHLAMYDIA
Chlamydia: NEGATIVE
Gonorrhea: NEGATIVE

## 2017-03-01 LAB — OB RESULTS CONSOLE RPR: RPR: NONREACTIVE

## 2017-03-01 LAB — OB RESULTS CONSOLE HEPATITIS B SURFACE ANTIGEN: HEP B S AG: NEGATIVE

## 2017-03-01 LAB — OB RESULTS CONSOLE RUBELLA ANTIBODY, IGM: RUBELLA: IMMUNE

## 2017-03-01 LAB — OB RESULTS CONSOLE HIV ANTIBODY (ROUTINE TESTING): HIV: NONREACTIVE

## 2017-04-07 IMAGING — US US TRANSVAGINAL NON-OB
1 series · 14 of 25 positions shown · non-contrast
Comparison: None

CLINICAL DATA: Pelvic pain for 7 days, right greater than left



[Series 1: us transvaginal non-ob · 0.21mm/px · 71 acquisitions, 14 frames shown]
[im 1/71]
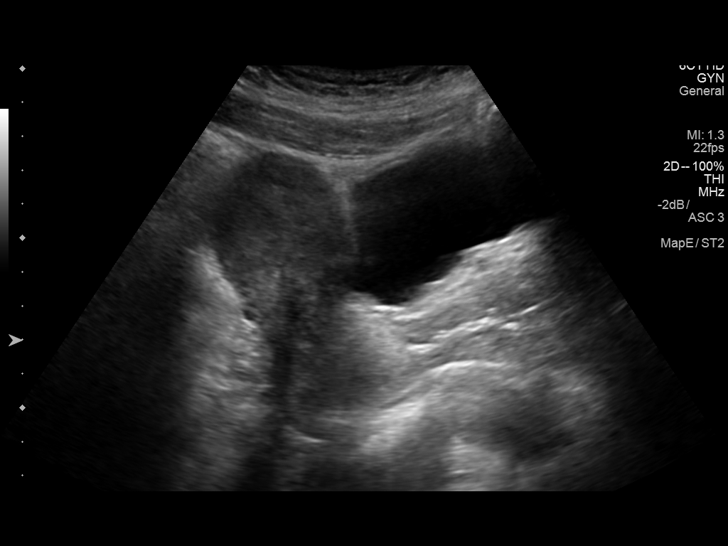
[im 6/71]
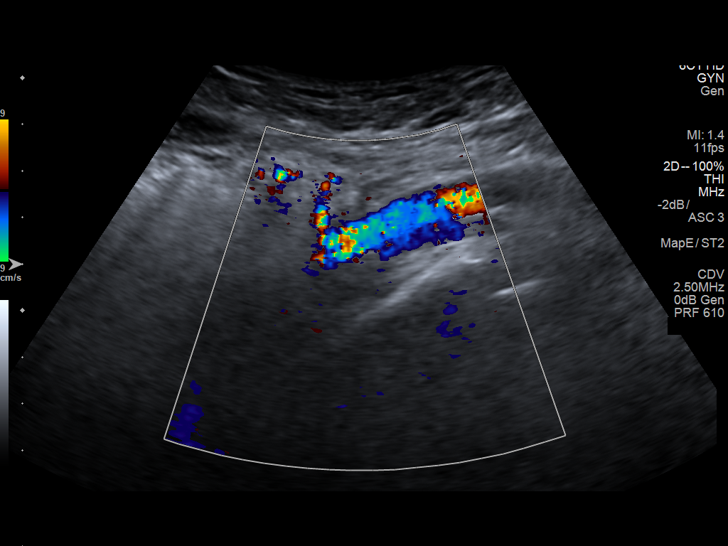
[im 12/71]
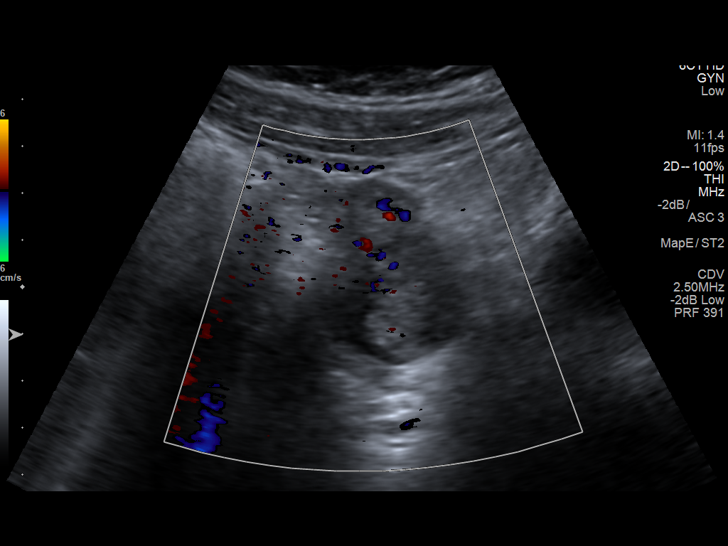
[im 18/71]
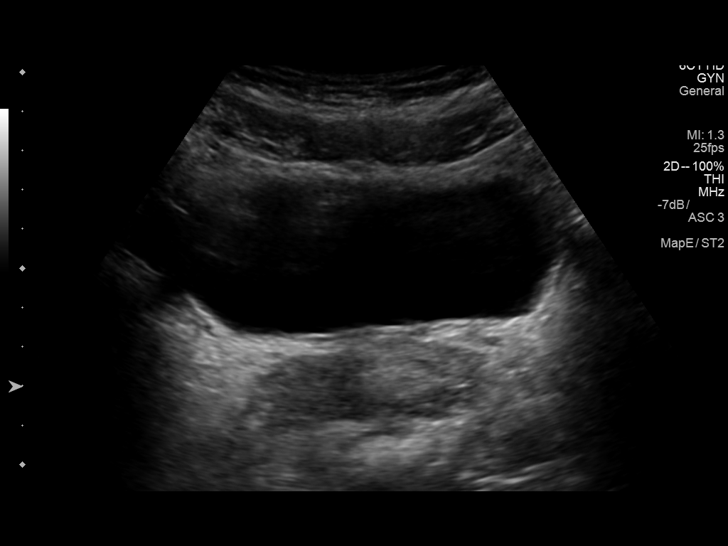
[im 24/71]
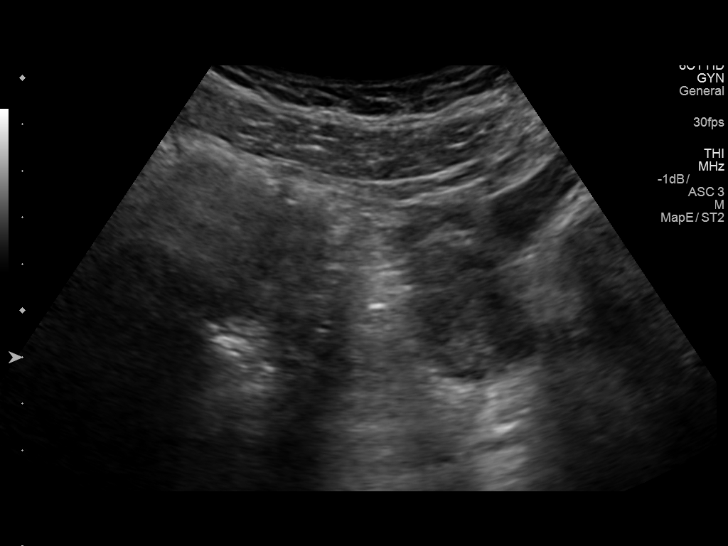
[im 27/71]
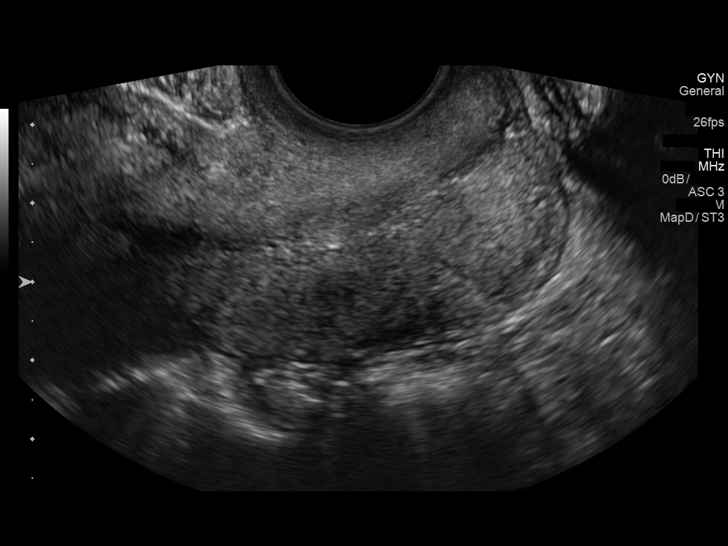
[im 33/71]
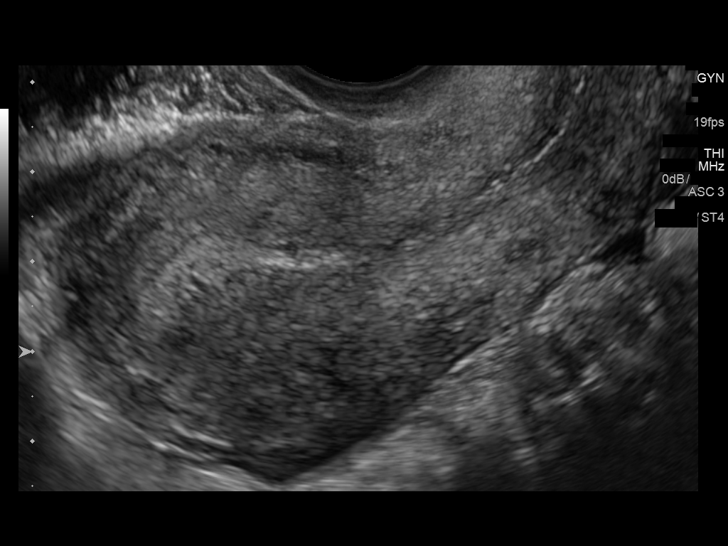
[im 38/71]
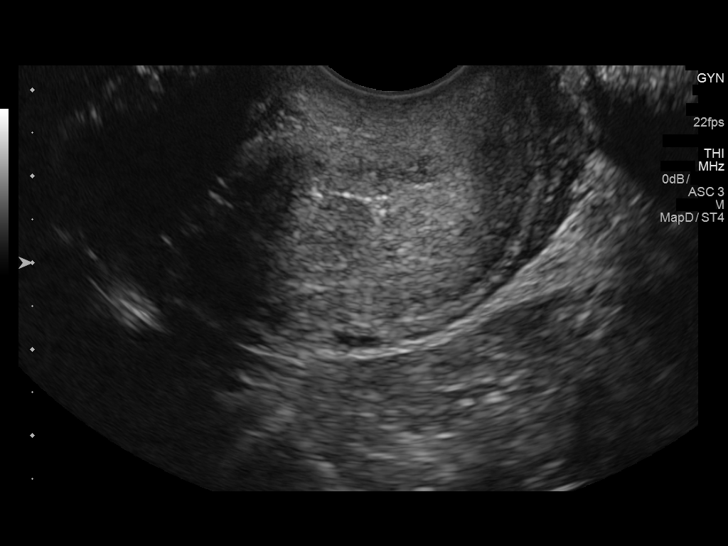
[im 44/71]
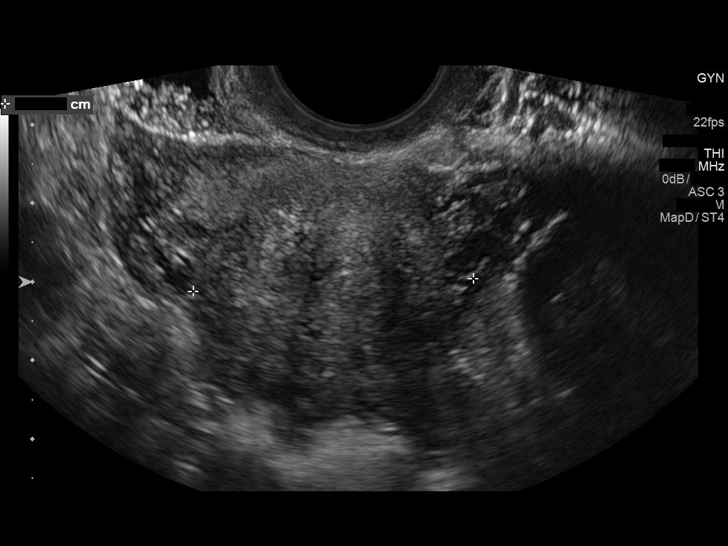
[im 47/71]
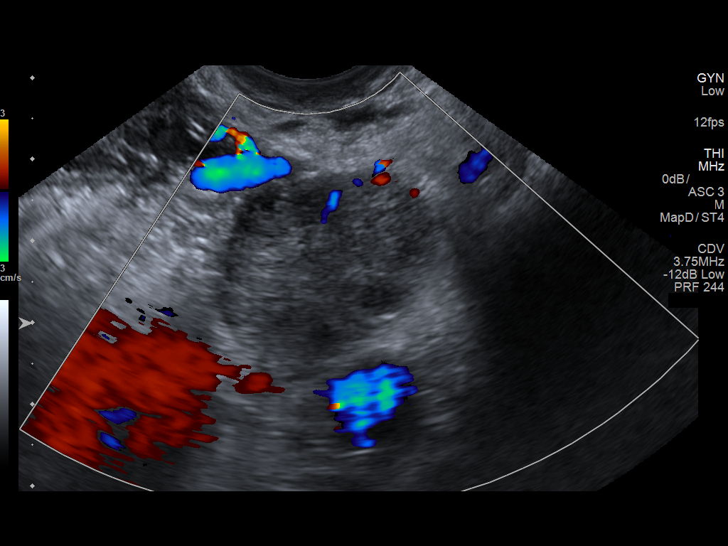
[im 53/71]
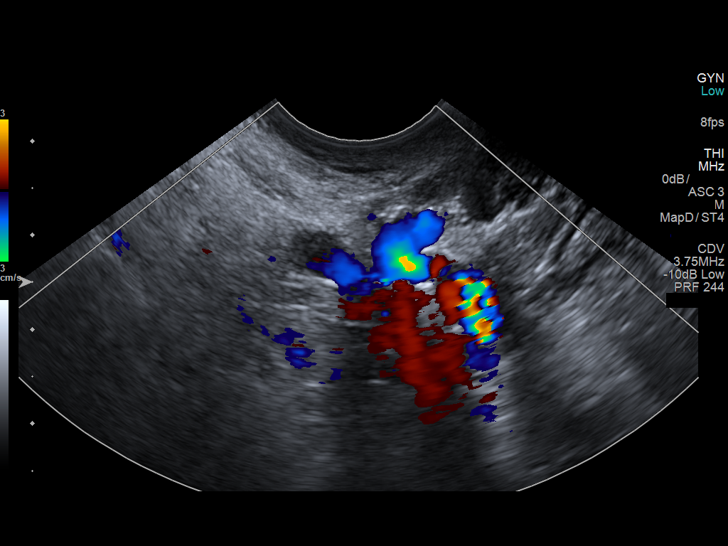
[im 59/71]
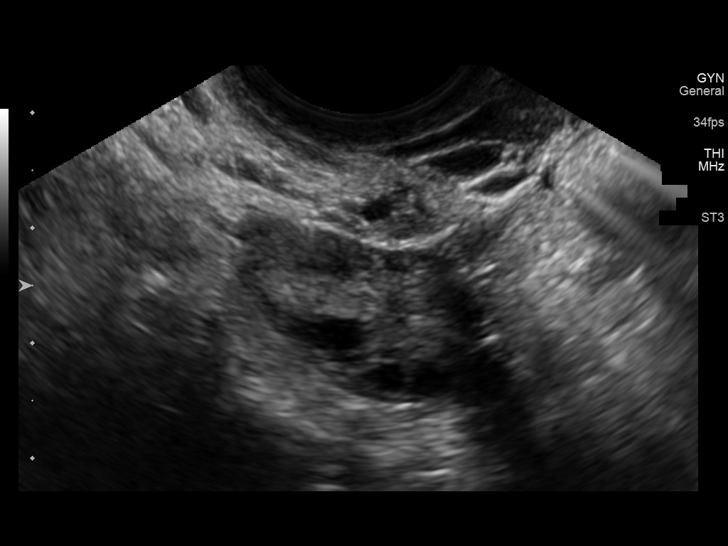
[im 65/71]
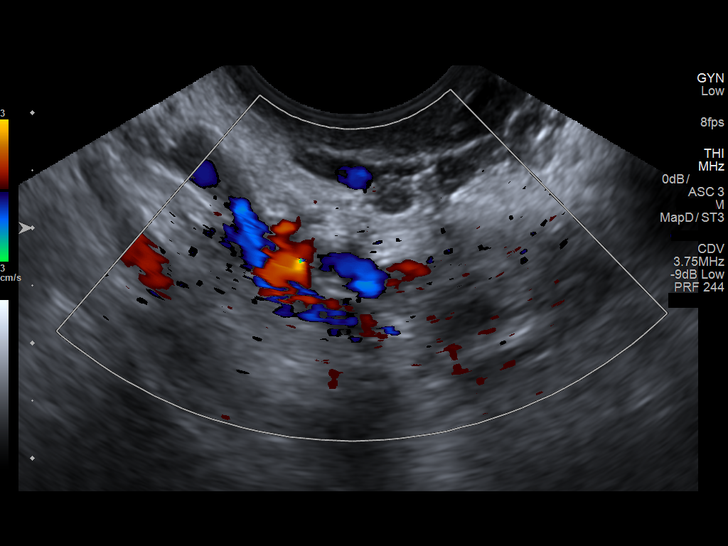
[im 71/71]
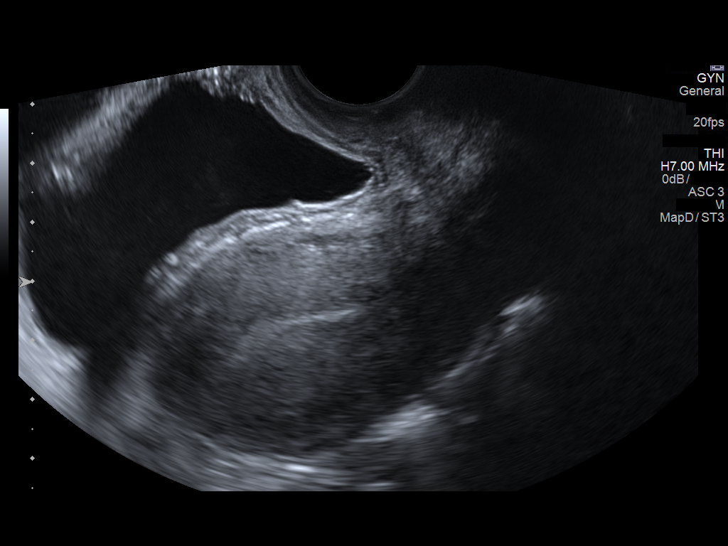

[14 of 25 positions shown; findings below may reference images not displayed]

FINDINGS: Uterus

Measurements: 8.0 x 4.0 x 3.6 cm. No fibroids or other mass
visualized.

Endometrium

Thickness: 8 mm in thickness.  No focal abnormality visualized.

Right ovary

Measurements: 2.7 x 1.3 x 1.7 cm. Normal appearance/no adnexal mass.

Left ovary

Measurements: 3.2 x 2.5 x 2.7 cm. Normal appearance/no adnexal mass.

Other findings

No abnormal free fluid.
IMPRESSION: Unremarkable pelvic ultrasound.

## 2017-04-07 IMAGING — US US ABDOMEN COMPLETE
1 series · 14 of 25 positions shown · non-contrast
Comparison: CT abdomen and pelvis of 10/14/2007

CLINICAL DATA: Abdominal pain

EXAM:
ABDOMEN ULTRASOUND COMPLETE

[Series 1: us abdomen complete · 0.20mm/px · 14 of 70 slices shown]
[im 1/70]
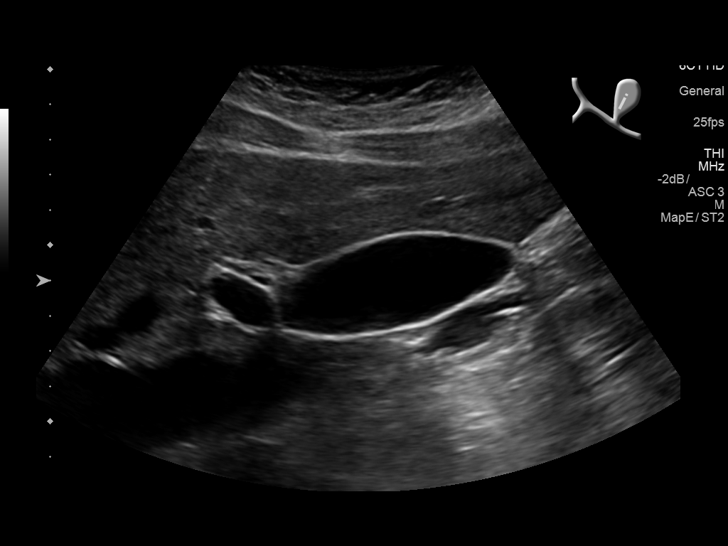
[im 6/70]
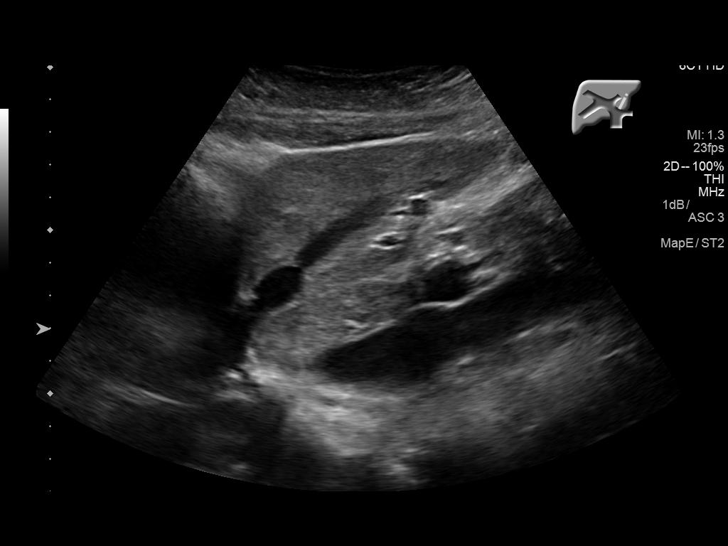
[im 12/70]
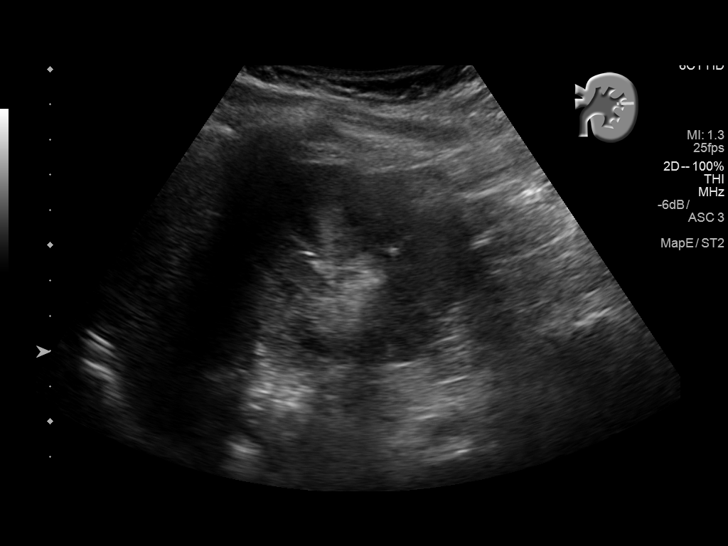
[im 18/70]
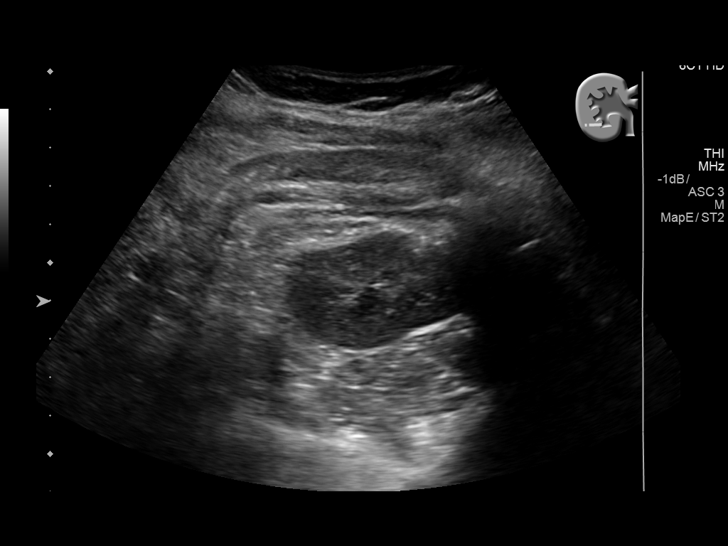
[im 24/70]
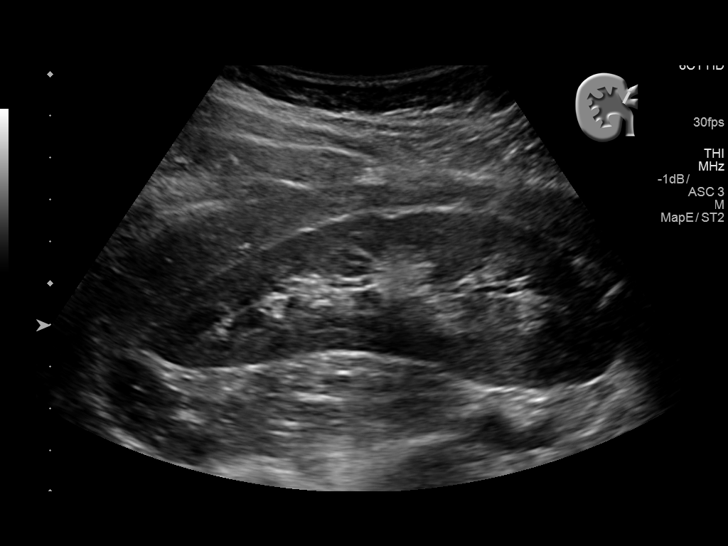
[im 26/70]
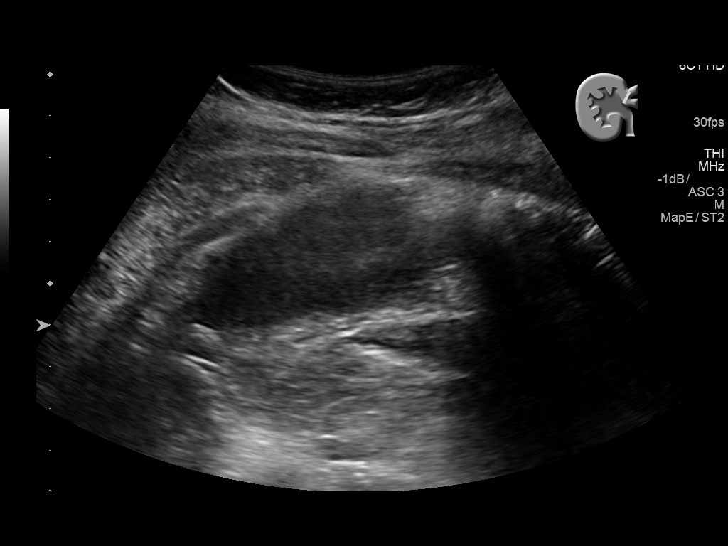
[im 32/70]
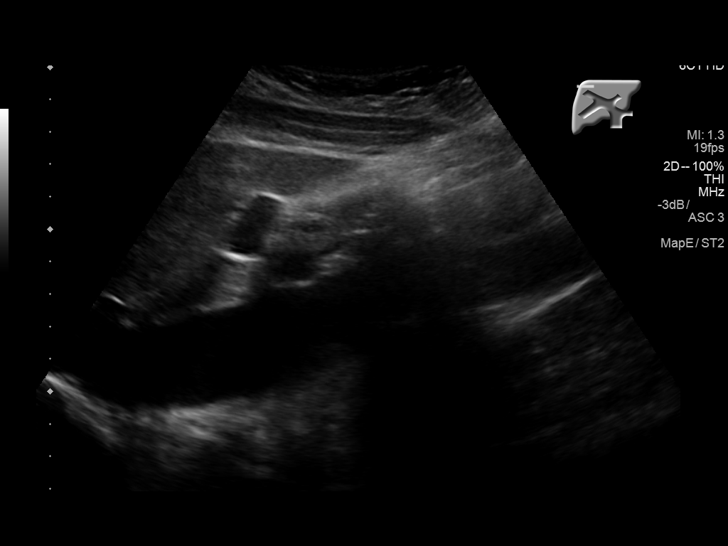
[im 38/70]
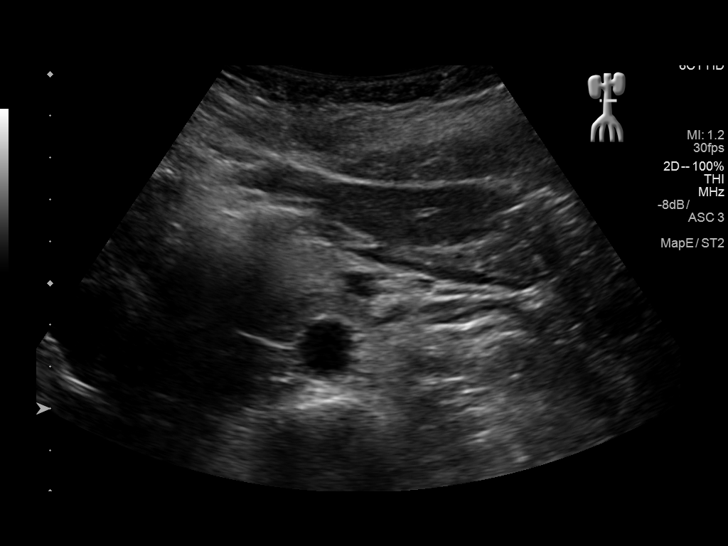
[im 44/70]
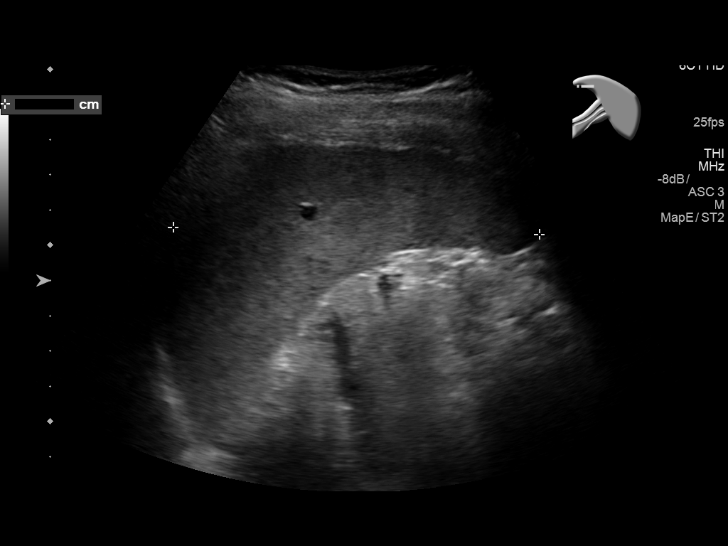
[im 47/70]
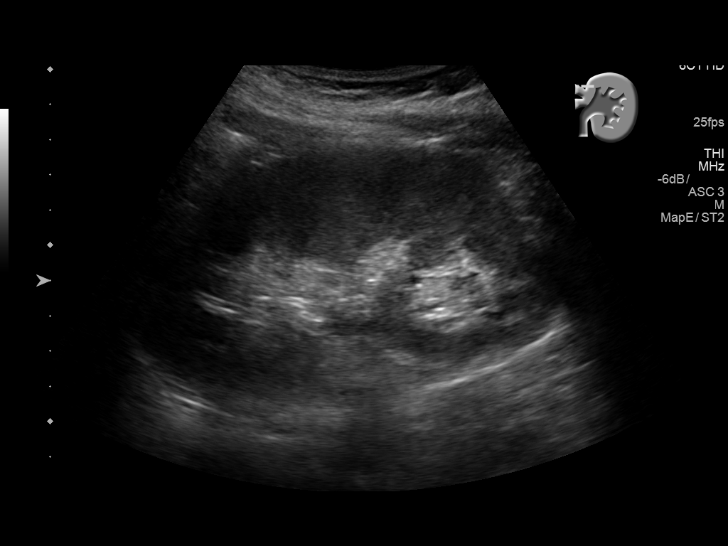
[im 52/70]
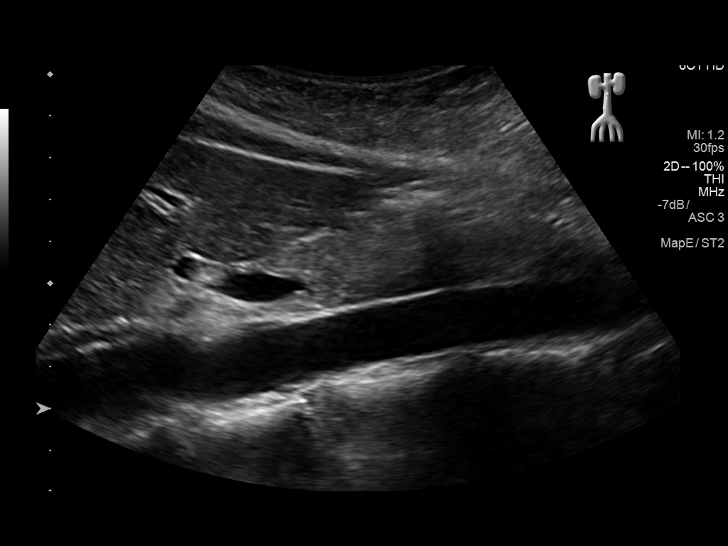
[im 58/70]
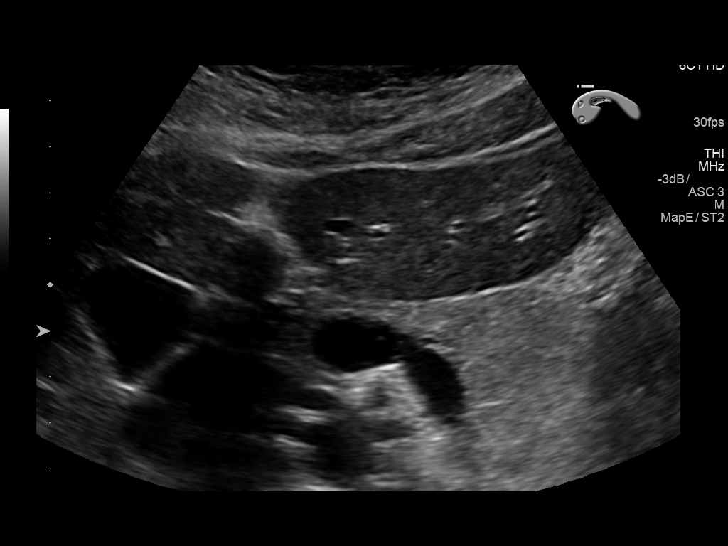
[im 64/70]
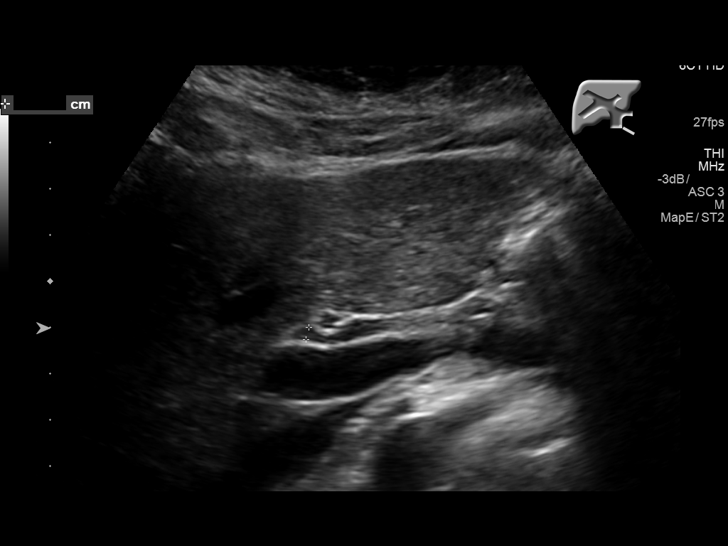
[im 70/70]
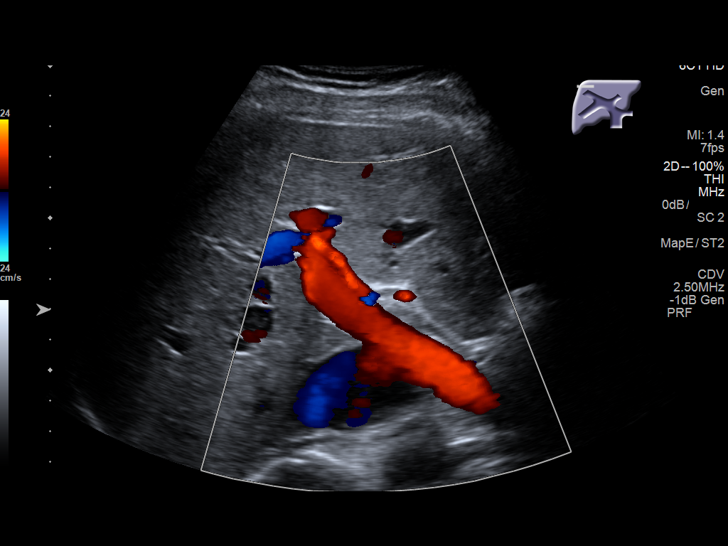

[14 of 25 positions shown; findings below may reference images not displayed]

FINDINGS: Gallbladder: The gallbladder is visualized and no gallstones are
noted. There is no pain over the gallbladder with compression.

Common bile duct: Diameter: The common bile duct is normal measuring
4 mm in diameter distally.

Liver: The liver has a normal echogenic pattern. No focal hepatic
abnormality is seen.

IVC: No abnormality visualized.

Pancreas: Only the most distal tail of the pancreas is obscured by
bowel gas.

Spleen: 10.4 cm.

Right Kidney: Length: 11.6 cm..  No hydronephrosis is seen.

Left Kidney: Length: 12.2 cm..  No hydronephrosis is noted.

Abdominal aorta: The abdominal aorta is normal in caliber.

Other findings: None.
IMPRESSION: Negative abdominal ultrasound. No gallstones. No ductal dilatation.

## 2017-04-30 NOTE — L&D Delivery Note (Signed)
Delivery Note At 9:06 PM a viable and healthy female was delivered via Vaginal, Spontaneous (Presentation: OA ).  APGAR: 8, 9; weight 8 lb 11.5 oz (3955 g).   Placenta status: spontaneous, intact, .  Cord:  with the following complications: NOne .  Cord pH: N/a  Anesthesia:  epidural Episiotomy: None Lacerations: Periurethral Suture Repair: 3.0 vicryl rapide Est. Blood Loss (mL): 300  Mom to postpartum.  Baby to Couplet care / Skin to Skin.  Elveria Royals 09/26/2017, 11:08 PM

## 2017-08-22 LAB — OB RESULTS CONSOLE GBS: GBS: NEGATIVE

## 2017-09-11 ENCOUNTER — Encounter (HOSPITAL_COMMUNITY): Payer: Self-pay | Admitting: *Deleted

## 2017-09-11 ENCOUNTER — Telehealth (HOSPITAL_COMMUNITY): Payer: Self-pay | Admitting: *Deleted

## 2017-09-11 NOTE — Telephone Encounter (Signed)
Preadmission screen  

## 2017-09-12 ENCOUNTER — Other Ambulatory Visit: Payer: Self-pay | Admitting: Obstetrics & Gynecology

## 2017-09-26 ENCOUNTER — Inpatient Hospital Stay (HOSPITAL_COMMUNITY): Admit: 2017-09-26 | Payer: Self-pay

## 2017-09-26 ENCOUNTER — Inpatient Hospital Stay (HOSPITAL_COMMUNITY)
Admission: RE | Admit: 2017-09-26 | Discharge: 2017-09-28 | DRG: 807 | Disposition: A | Discharge status: Home / Self Care | Payer: PRIVATE HEALTH INSURANCE | Attending: Obstetrics & Gynecology | Admitting: Obstetrics & Gynecology

## 2017-09-26 ENCOUNTER — Encounter (HOSPITAL_COMMUNITY): Payer: Self-pay

## 2017-09-26 ENCOUNTER — Inpatient Hospital Stay (HOSPITAL_COMMUNITY): Payer: PRIVATE HEALTH INSURANCE | Admitting: Anesthesiology

## 2017-09-26 DIAGNOSIS — Z6791 Unspecified blood type, Rh negative: Secondary | ICD-10-CM | POA: Diagnosis not present

## 2017-09-26 DIAGNOSIS — O26893 Other specified pregnancy related conditions, third trimester: Secondary | ICD-10-CM | POA: Diagnosis present

## 2017-09-26 DIAGNOSIS — Z3A40 40 weeks gestation of pregnancy: Secondary | ICD-10-CM | POA: Diagnosis not present

## 2017-09-26 DIAGNOSIS — Z3490 Encounter for supervision of normal pregnancy, unspecified, unspecified trimester: Secondary | ICD-10-CM | POA: Diagnosis present

## 2017-09-26 DIAGNOSIS — Z0000 Encounter for general adult medical examination without abnormal findings: Secondary | ICD-10-CM | POA: Diagnosis present

## 2017-09-26 LAB — RPR: RPR: NONREACTIVE

## 2017-09-26 LAB — CBC
HCT: 35.1 % — ABNORMAL LOW (ref 36.0–46.0)
Hemoglobin: 12.3 g/dL (ref 12.0–15.0)
MCH: 32.3 pg (ref 26.0–34.0)
MCHC: 35 g/dL (ref 30.0–36.0)
MCV: 92.1 fL (ref 78.0–100.0)
PLATELETS: 210 10*3/uL (ref 150–400)
RBC: 3.81 MIL/uL — AB (ref 3.87–5.11)
RDW: 13.4 % (ref 11.5–15.5)
WBC: 9.9 10*3/uL (ref 4.0–10.5)

## 2017-09-26 MED ORDER — IBUPROFEN 600 MG PO TABS
600.0000 mg | ORAL_TABLET | Freq: Four times a day (QID) | ORAL | Status: DC
  Administered 2017-09-27 – 2017-09-28 (×7): 600 mg via ORAL
  Filled 2017-09-26 (×7): qty 1

## 2017-09-26 MED ORDER — DIPHENHYDRAMINE HCL 25 MG PO CAPS: 25.0000 mg | ORAL_CAPSULE | Freq: Four times a day (QID) | ORAL | Status: DC | PRN

## 2017-09-26 MED ORDER — ONDANSETRON HCL 4 MG/2ML IJ SOLN: 4.0000 mg | INTRAMUSCULAR | Status: DC | PRN

## 2017-09-26 MED ORDER — LIDOCAINE HCL (PF) 1 % IJ SOLN
INTRAMUSCULAR | Status: AC
  Administered 2017-09-26: 30 mL
  Filled 2017-09-26: qty 30

## 2017-09-26 MED ORDER — PHENYLEPHRINE 40 MCG/ML (10ML) SYRINGE FOR IV PUSH (FOR BLOOD PRESSURE SUPPORT)
80.0000 ug | PREFILLED_SYRINGE | INTRAVENOUS | Status: DC | PRN
  Filled 2017-09-26: qty 10
  Filled 2017-09-26: qty 5

## 2017-09-26 MED ORDER — PHENYLEPHRINE 40 MCG/ML (10ML) SYRINGE FOR IV PUSH (FOR BLOOD PRESSURE SUPPORT)
80.0000 ug | PREFILLED_SYRINGE | INTRAVENOUS | Status: DC | PRN
  Filled 2017-09-26: qty 5

## 2017-09-26 MED ORDER — DIPHENHYDRAMINE HCL 50 MG/ML IJ SOLN: 12.5000 mg | INTRAMUSCULAR | Status: DC | PRN

## 2017-09-26 MED ORDER — ACETAMINOPHEN 325 MG PO TABS: 650.0000 mg | ORAL_TABLET | ORAL | Status: DC | PRN

## 2017-09-26 MED ORDER — DIBUCAINE 1 % RE OINT: 1.0000 "application " | TOPICAL_OINTMENT | RECTAL | Status: DC | PRN

## 2017-09-26 MED ORDER — LACTATED RINGERS IV SOLN: 500.0000 mL | INTRAVENOUS | Status: DC | PRN

## 2017-09-26 MED ORDER — OXYTOCIN 40 UNITS IN LACTATED RINGERS INFUSION - SIMPLE MED
1.0000 m[IU]/min | INTRAVENOUS | Status: DC
  Administered 2017-09-26: 2 m[IU]/min via INTRAVENOUS
  Filled 2017-09-26: qty 1000

## 2017-09-26 MED ORDER — TETANUS-DIPHTH-ACELL PERTUSSIS 5-2.5-18.5 LF-MCG/0.5 IM SUSP: 0.5000 mL | Freq: Once | INTRAMUSCULAR | Status: DC

## 2017-09-26 MED ORDER — LACTATED RINGERS IV SOLN
500.0000 mL | Freq: Once | INTRAVENOUS | Status: AC
  Administered 2017-09-26: 500 mL via INTRAVENOUS

## 2017-09-26 MED ORDER — LIDOCAINE HCL (PF) 1 % IJ SOLN
INTRAMUSCULAR | Status: DC | PRN
  Administered 2017-09-26: 8 mL via EPIDURAL

## 2017-09-26 MED ORDER — COCONUT OIL OIL
1.0000 "application " | TOPICAL_OIL | Status: DC | PRN
  Administered 2017-09-28: 1 via TOPICAL
  Filled 2017-09-26: qty 120

## 2017-09-26 MED ORDER — TERBUTALINE SULFATE 1 MG/ML IJ SOLN
0.2500 mg | Freq: Once | INTRAMUSCULAR | Status: DC | PRN
  Filled 2017-09-26: qty 1

## 2017-09-26 MED ORDER — LACTATED RINGERS IV SOLN
INTRAVENOUS | Status: DC
  Administered 2017-09-26 (×2): via INTRAVENOUS

## 2017-09-26 MED ORDER — EPHEDRINE 5 MG/ML INJ
10.0000 mg | INTRAVENOUS | Status: DC | PRN
  Filled 2017-09-26: qty 2

## 2017-09-26 MED ORDER — PRENATAL MULTIVITAMIN CH
1.0000 | ORAL_TABLET | Freq: Every day | ORAL | Status: DC
  Administered 2017-09-27 – 2017-09-28 (×2): 1 via ORAL
  Filled 2017-09-26 (×2): qty 1

## 2017-09-26 MED ORDER — ONDANSETRON HCL 4 MG/2ML IJ SOLN: 4.0000 mg | Freq: Four times a day (QID) | INTRAMUSCULAR | Status: DC | PRN

## 2017-09-26 MED ORDER — SOD CITRATE-CITRIC ACID 500-334 MG/5ML PO SOLN: 30.0000 mL | ORAL | Status: DC | PRN

## 2017-09-26 MED ORDER — ACETAMINOPHEN 325 MG PO TABS
650.0000 mg | ORAL_TABLET | ORAL | Status: DC | PRN
  Administered 2017-09-27 (×2): 650 mg via ORAL
  Filled 2017-09-26 (×2): qty 2

## 2017-09-26 MED ORDER — ZOLPIDEM TARTRATE 5 MG PO TABS: 5.0000 mg | ORAL_TABLET | Freq: Every evening | ORAL | Status: DC | PRN

## 2017-09-26 MED ORDER — WITCH HAZEL-GLYCERIN EX PADS: 1.0000 "application " | MEDICATED_PAD | CUTANEOUS | Status: DC | PRN

## 2017-09-26 MED ORDER — ONDANSETRON HCL 4 MG PO TABS: 4.0000 mg | ORAL_TABLET | ORAL | Status: DC | PRN

## 2017-09-26 MED ORDER — FENTANYL 2.5 MCG/ML BUPIVACAINE 1/10 % EPIDURAL INFUSION (WH - ANES)
14.0000 mL/h | INTRAMUSCULAR | Status: DC | PRN
  Administered 2017-09-26: 14 mL/h via EPIDURAL
  Filled 2017-09-26: qty 100

## 2017-09-26 MED ORDER — BENZOCAINE-MENTHOL 20-0.5 % EX AERO
1.0000 | INHALATION_SPRAY | CUTANEOUS | Status: DC | PRN
Start: 2017-09-26 — End: 2017-09-28
  Filled 2017-09-26: qty 56

## 2017-09-26 MED ORDER — SENNOSIDES-DOCUSATE SODIUM 8.6-50 MG PO TABS
2.0000 | ORAL_TABLET | ORAL | Status: DC
  Administered 2017-09-27 (×2): 2 via ORAL
  Filled 2017-09-26 (×2): qty 2

## 2017-09-26 MED ORDER — SIMETHICONE 80 MG PO CHEW: 80.0000 mg | CHEWABLE_TABLET | ORAL | Status: DC | PRN

## 2017-09-26 NOTE — Anesthesia Pain Management Evaluation Note (Signed)
  CRNA Pain Management Visit Note  Patient: Lacey Franklin, 36 y.o., female  "Hello I am a member of the anesthesia team at [facility]. We have an anesthesia team available at all times to provide care throughout the hospital, including epidural management and anesthesia for C-section. I don't know your plan for the delivery whether it a natural birth, water birth, IV sedation, nitrous supplementation, doula or epidural, but we want to meet your pain goals."   1.Was your pain managed to your expectations on prior hospitalizations?   Yes   2.What is your expectation for pain management during this hospitalization?     Epidural  3.How can we help you reach that goal? unsure  Record the patient's initial score and the patient's pain goal.   Pain: 7  Pain Goal: 7 [facility] wants you to be able to say your pain was always managed very well.  Casimer Lanius 09/26/2017

## 2017-09-26 NOTE — Progress Notes (Signed)
Dewanda Fennema is a 37 y.o. G3P1001 at [redacted]w[redacted]d by ultrasound admitted for induction of labor due to Elective at term.  Subjective: No pain  Objective: BP 116/76   Pulse 75   Temp 97.9 F (36.6 C) (Oral)   Resp 18   Ht 5\' 7"  (1.702 m)   Wt 210 lb 11.2 oz (95.6 kg)   BMI 33.00 kg/m  No intake/output data recorded. No intake/output data recorded.  FHT:  FHR: 140 bpm, variability: moderate,  accelerations:  Present,  decelerations:  Absent UC:   regular, every 3 minutes SVE:   Dilation: 3 Effacement (%): Thick Station: -2 Exam by:: Dr Benjie Karvonen AROM, clear fluid, high station   Labs: Lab Results  Component Value Date   WBC 9.9 09/26/2017   HGB 12.3 09/26/2017   HCT 35.1 (L) 09/26/2017   MCV 92.1 09/26/2017   PLT 210 09/26/2017    Assessment / Plan: Induction of labor due to term with favorable cervix,  progressing well on pitocin  Labor: Progressing normally but latent labor  Fetal Wellbeing:  Category I Pain Control:  options reviewed, planning epidural  I/D:  n/a Anticipated MOD:  NSVD  Elveria Royals 09/26/2017, 3:35 PM

## 2017-09-26 NOTE — Progress Notes (Signed)
Lacey Franklin is a 36 y.o. G3P1001 at [redacted]w[redacted]d  Subjective: Pelvic pressure   Objective: BP 109/67   Pulse 62   Temp 98.5 F (36.9 C) (Oral)   Resp 18   Ht 5\' 7"  (1.702 m)   Wt 210 lb 11.2 oz (95.6 kg)   SpO2 98%   BMI 33.00 kg/m  No intake/output data recorded. No intake/output data recorded.  FHT:  FHR: 120 bpm, variability: moderate,  accelerations:  Present,  decelerations:  Present variable decels with contractions but quick return to baseline, likely from head engagement since station now progressing better  UC:   regular, every 3 minutes SVE:   Dilation: 5.5 Effacement (%): 100 Station: 0, -1 Exam by:: [employer] RN  Labs: Lab Results  Component Value Date   WBC 9.9 09/26/2017   HGB 12.3 09/26/2017   HCT 35.1 (L) 09/26/2017   MCV 92.1 09/26/2017   PLT 210 09/26/2017    Assessment / Plan: Progressing well Epidural Anticipate SVD   Lacey Franklin R Lacey Franklin 09/26/2017, 7:08 PM

## 2017-09-26 NOTE — Anesthesia Procedure Notes (Signed)
Epidural Patient location during procedure: OB Start time: 09/26/2017 4:20 PM End time: 09/26/2017 4:25 PM  Staffing Anesthesiologist: Janeece Riggers, MD  Preanesthetic Checklist Completed: patient identified, site marked, surgical consent, pre-op evaluation, timeout performed, IV checked, risks and benefits discussed and monitors and equipment checked  Epidural Patient position: sitting Prep: site prepped and draped and DuraPrep Patient monitoring: continuous pulse ox and blood pressure Approach: midline Location: L2-L3 Injection technique: LOR air  Needle:  Needle type: Tuohy  Needle gauge: 17 G Needle length: 9 cm and 9 Needle insertion depth: 6 cm Catheter type: closed end flexible Catheter size: 19 Gauge Catheter at skin depth: 11 cm Test dose: negative  Assessment Events: blood not aspirated, injection not painful, no injection resistance, negative IV test and no paresthesia

## 2017-09-26 NOTE — Anesthesia Preprocedure Evaluation (Signed)

## 2017-09-26 NOTE — H&P (Signed)
Lacey Franklin is a 37 y.o. female presenting for labor IOL at 41wks, term, favrable cervix, maternal request due to exhaustion Uncomplicated pregnancy except AMA, normal labs.  Fatigue, poor sleep. Migraine, stable asthma.  Rh neg, took Rhogam.  H/o PVCs seen cardio in past, stable. H/o depression, will watch PP  OB History    Gravida  3   Para  1   Term  1   Preterm      AB      Living  1     SAB      TAB      Ectopic      Multiple      Live Births  1          Past Medical History:  Diagnosis Date  . Allergy   . Asthma   . PONV (postoperative nausea and vomiting)   . Vaginal Pap smear, abnormal    Past Surgical History:  Procedure Laterality Date  . COLPOSCOPY    . WISDOM TOOTH EXTRACTION     Family History: family history includes Cancer in her father, maternal aunt, maternal grandfather, maternal grandmother, and mother; Cervical cancer in her maternal aunt; Heart disease in her paternal grandmother; Hyperlipidemia in her paternal grandfather; Hypertension in her father, paternal grandfather, and paternal grandmother; Parkinson's disease in her maternal grandmother and paternal grandfather; Varicose Veins in her mother and paternal grandmother. Social History:  reports that she has never smoked. She has never used smokeless tobacco. She reports that she drinks alcohol. She reports that she does not use drugs.     Maternal Diabetes: No Genetic Screening: Normal - Informaseq normal , AFP1 normal  Maternal Ultrasounds/Referrals: Normal Fetal Ultrasounds or other Referrals:  None Maternal Substance Abuse:  No Significant Maternal Medications:  None Significant Maternal Lab Results:  Lab values include: Group B Strep negative, Rh negative Other Comments:  Rhogam  ROS History Dilation: 3 Effacement (%): Thick Station: -2 Exam by:: Dr Benjie Karvonen Blood pressure 116/76, pulse 75, temperature 97.9 F (36.6 C), temperature source Oral, resp. rate 18, height 5'  7" (1.702 m), weight 210 lb 11.2 oz (95.6 kg). Exam Physical Exam  Physical exam:  A&O x 3, no acute distress. Pleasant HEENT neg, no thyromegaly Lungs CTA bilat CV RRR, S1S2 normal Abdo soft, non tender, non acute Extr no edema/ tenderness Pelvic 2/50%./ -3/ vx  FHT  140s cat I Toco rare   Prenatal labs: ABO, Rh: --/--/O NEG (05/30 0803) Antibody: POS (05/30 0803) Rubella: Immune (11/02 0000) RPR: Nonreactive (11/02 0000)  HBsAg: Negative (11/02 0000)  HIV: Non-reactive (11/02 0000)  GBS: Negative (04/25 0000)  Informaseq normal, XY AFP1 neg   Assessment/Plan: 37 yo G3P1011, 40 wks, with prior SVD. Early labor IOL since favorable cervix.  Anticipate SVD 8 lbs. Epidural desired, ambulate until then  Lacey Franklin 09/26/2017, 4:15 PM

## 2017-09-27 ENCOUNTER — Encounter (HOSPITAL_COMMUNITY): Payer: Self-pay

## 2017-09-27 LAB — CBC
HEMATOCRIT: 34 % — AB (ref 36.0–46.0)
Hemoglobin: 11.7 g/dL — ABNORMAL LOW (ref 12.0–15.0)
MCH: 32.1 pg (ref 26.0–34.0)
MCHC: 34.4 g/dL (ref 30.0–36.0)
MCV: 93.2 fL (ref 78.0–100.0)
PLATELETS: 205 10*3/uL (ref 150–400)
RBC: 3.65 MIL/uL — AB (ref 3.87–5.11)
RDW: 13.4 % (ref 11.5–15.5)
WBC: 15.5 10*3/uL — ABNORMAL HIGH (ref 4.0–10.5)

## 2017-09-27 MED ORDER — RHO D IMMUNE GLOBULIN 1500 UNIT/2ML IJ SOSY
300.0000 ug | PREFILLED_SYRINGE | Freq: Once | INTRAMUSCULAR | Status: AC
  Administered 2017-09-27: 300 ug via INTRAVENOUS
  Filled 2017-09-27: qty 2

## 2017-09-27 NOTE — Anesthesia Postprocedure Evaluation (Signed)
Anesthesia Post Note  Patient: Agricultural engineer  Procedure(s) Performed: AN AD [place]     Patient location during evaluation: Mother Baby Anesthesia Type: Epidural Level of consciousness: awake Pain management: pain level controlled Vital Signs Assessment: post-procedure vital signs reviewed and stable Respiratory status: spontaneous breathing Cardiovascular status: stable Postop Assessment: patient able to bend at knees and epidural receding Anesthetic complications: no    Last Vitals:  Vitals:   09/27/17 0053 09/27/17 0529  BP: 120/63 102/79  Pulse: 70 66  Resp: 17   Temp:    SpO2:      Last Pain:  Vitals:   09/27/17 0529  TempSrc:   PainSc: 0-No pain   Pain Goal:                 Everette Rank

## 2017-09-27 NOTE — Progress Notes (Signed)
MOB was referred for history of depression/anxiety. * Referral screened out by Clinical Social Worker because none of the following criteria appear to apply: ~ History of anxiety/depression during this pregnancy, or of post-partum depression. ~ Diagnosis of anxiety and/or depression within last 3 years; No concerns noted in OB record. OR * MOB's symptoms currently being treated with medication and/or therapy.  Please contact the Clinical Social Worker if needs arise, by MOB request, or if MOB scores greater than 9/yes to question 10 on [place] Postpartum Depression Screen.  Nia Nathaniel Boyd-Gilyard, MSW, LCSW Clinical Social Work [phone]  

## 2017-09-27 NOTE — Lactation Note (Signed)
This note was copied from a baby's chart. Lactation Consultation Note  Patient Name: Lacey Franklin [id number]'E Date: 09/27/2017 Reason for consult: Initial assessment;Term  4 hours old FT female who is being exclusively BF by his mother, she's a P2 and experienced BF. Mom was able to BF her first child for 10 months, and the only BF challenge she had is that he was a colicky baby and always wanted to nurse for comfort and she couldn't even pump to store her milk.  Mom has a Spectra 1 DEBP at home and her only concern at this point is to latch baby to the left breast, it also used to be the challenging one to latch her first child.  Baby was swaddled and already cueing when entering the room, offered assistance with latch but mom voiced she won't do STS because she's too uncomfortable to do that at the hospital, she was having visitors coming in. Dad un-swaddled baby but left sleeper on, baby was fully clothed when nursing. LC took baby to mom's left breast and baby was able to get a deep latch right away, swallows were heard and sucking pattern was rhythmical. Parents were very pleased.  Baby fell asleep at the breast after 10' but parents voiced that they understood the importance of STS and will try it at home. Discussed key points for a deep latch. Parents were receptive and had lots of questions. Mom may request a DEBP from hospital to start pumping early on, she'll think about it and let her RN know. Parents reported all questions were anwered  Encouraged mom to feed baby STS 8-10 times/24 hours or sooner if feeding cues are present. Discussed BF brochure, BF resources and feeding diary, parents aware of [facility] services and will call PRN.  Maternal Data Formula Feeding for Exclusion: No Has patient been taught Hand Expression?: Yes Does the patient have breastfeeding experience prior to this delivery?: Yes  Feeding Feeding Type: Breast Fed Length of feed: 10 min  LATCH Score Latch: Grasps  breast easily, tongue down, lips flanged, rhythmical sucking.  Audible Swallowing: A few with stimulation  Type of Nipple: Everted at rest and after stimulation  Comfort (Breast/Nipple): Soft / non-tender  Hold (Positioning): No assistance needed to correctly position infant at breast.(Minimal assistance)  LATCH Score: 9  Interventions Interventions: Breast feeding basics reviewed;Assisted with latch;Breast massage;Breast compression;Support pillows;Adjust position  Lactation Tools Discussed/Used WIC Program: No   Consult Status Consult Status: Follow-up Date: 09/28/17 Follow-up type: In-patient    Sayward Horvath Francene Boyers 09/27/2017, 1:10 PM

## 2017-09-27 NOTE — Progress Notes (Signed)
Post Partum Day 1, SVD BOY Subjective: no complaints, up ad lib, voiding and tolerating PO pain well controlled  Objective: Blood pressure 102/79, pulse 66, temperature 99 F (37.2 C), temperature source Oral, resp. rate 17, height 5\' 7"  (1.702 m), weight 210 lb 11.2 oz (95.6 kg), SpO2 98 %, unknown if currently breastfeeding.  Physical Exam:  General: alert and cooperative  Lungs CTA bilat CV RRR Lochia: appropriate Uterine Fundus: firm Incision: periurethral lac, no pain DVT Evaluation: No evidence of DVT seen on physical exam.  Recent Labs    09/26/17 0803 09/27/17 0522  HGB 12.3 11.7*  HCT 35.1* 34.0*   O(-)  (baby A+) --> rhogam needed Rub Imm  Assessment/Plan: Plan for discharge tomorrow, Breastfeeding and Circumcision prior to discharge   LOS: 1 day   Lacey Franklin 09/27/2017, 9:34 AM

## 2017-09-28 LAB — RH IG WORKUP (INCLUDES ABO/RH)
ABO/RH(D): O NEG
FETAL SCREEN: NEGATIVE
Gestational Age(Wks): 40
Unit division: 0

## 2017-09-28 MED ORDER — IBUPROFEN 600 MG PO TABS: 600.0000 mg | ORAL_TABLET | Freq: Four times a day (QID) | ORAL | 0 refills | Status: AC

## 2017-09-28 NOTE — Discharge Instructions (Signed)
See [employer] brochure

## 2017-09-28 NOTE — Lactation Note (Addendum)
This note was copied from a baby's chart. Lactation Consultation Note Baby 47 hrs old. Cluster feeding. During the night baby is screaming at the breast not latching. Mom states gassy.  Mom has large everted nipples. Baby would suckle on breast a few times, cry, suckle cry, push away w/hands.  Mom and FOB frustrated and exhausted.  Moms breast are sore from hand expressing colostrum all day. Mom states the only time baby is quiet is if baby has a pacifier. Mom stated she put pacifier in mouth d/t keeping hand out. Discuss feeding cues and discouraged pacifiers for 2 weeks. Mom stated she knew but was at her limit. Mom stated she had to supplement her son until her milk came in which was 5 days. Discussed pumping to induce lactation and supplement collects colostrum.  LC offered to spoon feed or syring feed baby some formula. Mom in agreement. Mom's feet and legs are very edematous and hurt. Mom states she needs to lay in bed and elevate feet and legs and rest. Mom states she hasn't slept in 2 days. LC offered to take baby to CN for mom a nap. LC verified ID bracelets w/mom and baby.  LC finger fed baby Similac formula w/curve tip syring. Noted baby doesn't cup tongue under finger we. Mom states baby keeps tucking bottom lip in mouth. LC had to flange upper lip and chin tug. Baby appears to may have posterior tight frenulum and labial frenulum. Baby can extend tongue past gum line some.  Tolerated formula well.  Patient Name: Boy Atoya Andrew DJSHF'W Date: 09/28/2017 Reason for consult: Mother's request;Difficult latch   Maternal Data    Feeding Feeding Type: Breast Fed Length of feed: 10 min(baby attempting to feed for 20 minutes fussing at breast)  LATCH Score Latch: Too sleepy or reluctant, no latch achieved, no sucking elicited.  Audible Swallowing: None  Type of Nipple: Everted at rest and after stimulation  Comfort (Breast/Nipple): Soft / non-tender  Hold (Positioning):  Assistance needed to correctly position infant at breast and maintain latch.  LATCH Score: 5  Interventions Interventions: Breast feeding basics reviewed;Support pillows;Assisted with latch;Position options;Breast massage;Hand express;Breast compression;Adjust position  Lactation Tools Discussed/Used     Consult Status Consult Status: Follow-up Date: 09/28/17 Follow-up type: In-patient    Theodoro Kalata 09/28/2017, 4:27 AM

## 2017-09-28 NOTE — Lactation Note (Signed)
This note was copied from a baby's chart. Lactation Consultation Note  Patient Name: Lacey Franklin PSUGA'Y Date: 09/28/2017 Reason for consult: Follow-up assessment Baby has been sleeping since formula supplementation.  Mom states last night was very difficult to latch baby to breast.  Breast tissue is sore from breast massage.  Baby is currently sleeping after circumcision.  Instructed to call for assist prn.  Maternal Data    Feeding    LATCH Score                   Interventions    Lactation Tools Discussed/Used     Consult Status Consult Status: Complete Follow-up type: Call as needed    Ave Filter 09/28/2017, 9:52 AM

## 2017-09-28 NOTE — Lactation Note (Signed)
This note was copied from a baby's chart. Lactation Consultation Note Took baby to rm. Discussed w/mom syring feeding, baby still gassy. Encouraged STS. Reviewed breast care. Patient Name: Lacey Franklin [id number]'C Date: 09/28/2017 Reason for consult: Mother's request;Difficult latch   Maternal Data    Feeding Feeding Type: Formula  LATCH Score Latch: Too sleepy or reluctant, no latch achieved, no sucking elicited.  Audible Swallowing: None  Type of Nipple: Everted at rest and after stimulation  Comfort (Breast/Nipple): Soft / non-tender  Hold (Positioning): Assistance needed to correctly position infant at breast and maintain latch.  LATCH Score: 5  Interventions Interventions: Breast feeding basics reviewed;Support pillows;Assisted with latch;Position options;Breast massage;Hand express;Breast compression;Adjust position  Lactation Tools Discussed/Used     Consult Status Consult Status: Follow-up Date: 09/28/17 Follow-up type: In-patient    Theodoro Kalata 09/28/2017, 6:42 AM

## 2017-09-29 LAB — BPAM RBC
BLOOD PRODUCT EXPIRATION DATE: 201906152359
BLOOD PRODUCT EXPIRATION DATE: 201906202359
UNIT TYPE AND RH: 9500
Unit Type and Rh: 9500

## 2017-09-29 LAB — TYPE AND SCREEN
ABO/RH(D): O NEG
Antibody Screen: POSITIVE
UNIT DIVISION: 0
Unit division: 0

## 2017-10-01 NOTE — Discharge Summary (Signed)
Obstetric Discharge Summary Reason for Admission: induction of labor Prenatal Procedures: ultrasound Intrapartum Procedures: spontaneous vaginal delivery Postpartum Procedures: Rho(D) Ig Complications-Operative and Postpartum: periurethral laceration LastLabs       Hemoglobin  Date Value Ref Range Status  09/27/2017 11.7 (L) 12.0 - 15.0 g/dL Final        HCT  Date Value Ref Range Status  09/27/2017 34.0 (L) 36.0 - 46.0 % Final      Physical Exam:  General: alert, cooperative and no distress Lochia: appropriate Uterine Fundus: firm Perineum: healing well DVT Evaluation: 1+ edema BLE, no calf tenderness, pain, or cords  Discharge Diagnoses: Term Pregnancy-delivered  Discharge Information: Date: 09/28/2017 Activity: pelvic rest Diet: routine Medications: See discharge medication list  Condition: stable Instructions: refer to practice specific booklet Discharge to: home    Follow-up Information    Azucena Fallen, MD Follow up in 6 week(s).   Specialty:  Obstetrics and Gynecology Contact information: [place] [address] [phone]           Newborn Data: Live born female  Birth Weight: 8 lb 11.5 oz (3955 g) APGAR: 25, 9  Newborn Delivery   Birth date/time:  09/26/2017 21:06:00 Delivery type:  Vaginal, Spontaneous     Home with mother.  Arrie Eastern 09/28/2017, 12:59 PM

## 2018-02-13 DIAGNOSIS — F419 Anxiety disorder, unspecified: Secondary | ICD-10-CM | POA: Diagnosis not present

## 2018-02-13 DIAGNOSIS — Z6829 Body mass index (BMI) 29.0-29.9, adult: Secondary | ICD-10-CM | POA: Diagnosis not present

## 2018-02-13 DIAGNOSIS — Z01419 Encounter for gynecological examination (general) (routine) without abnormal findings: Secondary | ICD-10-CM | POA: Diagnosis not present

## 2018-02-18 DIAGNOSIS — D225 Melanocytic nevi of trunk: Secondary | ICD-10-CM | POA: Diagnosis not present

## 2018-02-18 DIAGNOSIS — D2261 Melanocytic nevi of right upper limb, including shoulder: Secondary | ICD-10-CM | POA: Diagnosis not present

## 2018-02-18 DIAGNOSIS — Z808 Family history of malignant neoplasm of other organs or systems: Secondary | ICD-10-CM | POA: Diagnosis not present

## 2018-02-18 DIAGNOSIS — D485 Neoplasm of uncertain behavior of skin: Secondary | ICD-10-CM | POA: Diagnosis not present

## 2018-02-18 DIAGNOSIS — Z86018 Personal history of other benign neoplasm: Secondary | ICD-10-CM | POA: Diagnosis not present

## 2018-03-17 DIAGNOSIS — J45909 Unspecified asthma, uncomplicated: Secondary | ICD-10-CM | POA: Diagnosis not present

## 2018-03-17 DIAGNOSIS — J0100 Acute maxillary sinusitis, unspecified: Secondary | ICD-10-CM | POA: Diagnosis not present

## 2018-03-17 DIAGNOSIS — R05 Cough: Secondary | ICD-10-CM | POA: Diagnosis not present

## 2018-03-17 DIAGNOSIS — Z760 Encounter for issue of repeat prescription: Secondary | ICD-10-CM | POA: Diagnosis not present

## 2019-03-05 DIAGNOSIS — D2271 Melanocytic nevi of right lower limb, including hip: Secondary | ICD-10-CM | POA: Diagnosis not present

## 2019-03-05 DIAGNOSIS — L738 Other specified follicular disorders: Secondary | ICD-10-CM | POA: Diagnosis not present

## 2019-03-05 DIAGNOSIS — D225 Melanocytic nevi of trunk: Secondary | ICD-10-CM | POA: Diagnosis not present

## 2019-03-05 MED FILL — FLUCONAZOLE 200 MG TAB: 200 | 7 days supply | Qty: 7 | Fill #0

## 2019-03-05 MED FILL — KETOCONAZOLE 2% SHAMPOO: 2 | 30 days supply | Qty: 120 | Fill #0

## 2019-03-18 DIAGNOSIS — Z01419 Encounter for gynecological examination (general) (routine) without abnormal findings: Secondary | ICD-10-CM | POA: Diagnosis not present

## 2019-03-18 DIAGNOSIS — Z1151 Encounter for screening for human papillomavirus (HPV): Secondary | ICD-10-CM | POA: Diagnosis not present

## 2019-03-18 DIAGNOSIS — Z6826 Body mass index (BMI) 26.0-26.9, adult: Secondary | ICD-10-CM | POA: Diagnosis not present

## 2020-01-02 ENCOUNTER — Telehealth: Payer: PRIVATE HEALTH INSURANCE | Admitting: Family

## 2020-01-02 DIAGNOSIS — B9689 Other specified bacterial agents as the cause of diseases classified elsewhere: Secondary | ICD-10-CM

## 2020-01-02 DIAGNOSIS — J019 Acute sinusitis, unspecified: Secondary | ICD-10-CM

## 2020-01-02 MED ORDER — AMOXICILLIN-POT CLAVULANATE 875-125 MG PO TABS: 1.0000 | ORAL_TABLET | Freq: Two times a day (BID) | ORAL | 0 refills | Status: DC

## 2020-01-02 NOTE — Progress Notes (Signed)

## 2020-03-12 ENCOUNTER — Telehealth: Payer: PRIVATE HEALTH INSURANCE | Admitting: Emergency Medicine

## 2020-03-12 DIAGNOSIS — J069 Acute upper respiratory infection, unspecified: Secondary | ICD-10-CM

## 2020-03-12 MED ORDER — PREDNISONE 10 MG PO TABS: 10.0000 mg | ORAL_TABLET | Freq: Every day | ORAL | 0 refills | Status: AC

## 2020-03-12 MED ORDER — BENZONATATE 200 MG PO CAPS: 200.0000 mg | ORAL_CAPSULE | Freq: Three times a day (TID) | ORAL | 0 refills | Status: DC | PRN

## 2020-03-12 NOTE — Progress Notes (Signed)
Time spent: 10 min  We are sorry that you are not feeling well.  Here is how we plan to help!  Based on your presentation I believe you most likely have A cough due to a virus.  This is called viral bronchitis and is best treated by rest, plenty of fluids and control of the cough.  You may use Ibuprofen or Tylenol as directed to help your symptoms.     In addition you may use A prescription cough medication called Tessalon Perles 100mg . You may take 1-2 capsules every 8 hours as needed for your cough.  Prednisone 10 mg daily for 6 days (see taper instructions below)   I recommend 2 puffs of albuterol inhaler every 4 hours. Continue flonase one spray in each nostril morning and night.  You may take over the counter mucinex which can help thin out mucus and help cough it up. Tessalon perles are anti-cough and will stop cough, this can be used right before bed time to help with sleep.   At this time I do not think you require antibiotics, especially since your child came home with similar symptoms. Your symptoms are most likely from a virus leading to bronchitis (cough, phlegm, wheezing).    From your responses in the eVisit questionnaire you describe inflammation in the upper respiratory tract which is causing a significant cough.  This is commonly called Bronchitis and has four common causes:    Allergies  Viral Infections  Acid Reflux  Bacterial Infection Allergies, viruses and acid reflux are treated by controlling symptoms or eliminating the cause. An example might be a cough caused by taking certain blood pressure medications. You stop the cough by changing the medication. Another example might be a cough caused by acid reflux. Controlling the reflux helps control the cough.  USE OF BRONCHODILATOR ("RESCUE") INHALERS: There is a risk from using your bronchodilator too frequently.  The risk is that over-reliance on a medication which only relaxes the muscles surrounding the breathing  tubes can reduce the effectiveness of medications prescribed to reduce swelling and congestion of the tubes themselves.  Although you feel brief relief from the bronchodilator inhaler, your asthma may actually be worsening with the tubes becoming more swollen and filled with mucus.  This can delay other crucial treatments, such as oral steroid medications. If you need to use a bronchodilator inhaler daily, several times per day, you should discuss this with your provider.  There are probably better treatments that could be used to keep your asthma under control.     HOME CARE . Only take medications as instructed by your medical team. . Complete the entire course of an antibiotic. . Drink plenty of fluids and get plenty of rest. . Avoid close contacts especially the very young and the elderly . Cover your mouth if you cough or cough into your sleeve. . Always remember to wash your hands . A steam or ultrasonic humidifier can help congestion.   GET HELP RIGHT AWAY IF: . You develop worsening fever. . You become short of breath . You cough up blood. . Your symptoms persist after you have completed your treatment plan MAKE SURE YOU   Understand these instructions.  Will watch your condition.  Will get help right away if you are not doing well or get worse.  Your e-visit answers were reviewed by a board certified advanced clinical practitioner to complete your personal care plan.  Depending on the condition, your plan could have included both  over the counter or prescription medications. If there is a problem please reply  once you have received a response from your provider. Your safety is important to [place].  If you have drug allergies check your prescription carefully.    You can use MyChart to ask questions about today's visit, request a non-urgent call back, or ask for a work or school excuse for 24 hours related to this e-Visit. If it has been greater than 24 hours you will need to follow  up with your provider, or enter a new e-Visit to address those concerns. You will get an e-mail in the next two days asking about your experience.  I hope that your e-visit has been valuable and will speed your recovery. Thank you for using e-visits.

## 2020-03-13 MED ORDER — BENZONATATE 100 MG PO CAPS: 100.0000 mg | ORAL_CAPSULE | Freq: Three times a day (TID) | ORAL | 0 refills | Status: DC | PRN

## 2020-03-13 NOTE — Addendum Note (Signed)
Addended by: Waldon Merl on: 03/13/2020 01:31 PM   Modules accepted: Orders

## 2020-03-19 ENCOUNTER — Telehealth: Payer: PRIVATE HEALTH INSURANCE | Admitting: Nurse Practitioner

## 2020-03-19 DIAGNOSIS — R059 Cough, unspecified: Secondary | ICD-10-CM

## 2020-03-19 MED ORDER — DOXYCYCLINE HYCLATE 100 MG PO TABS
100.0000 mg | ORAL_TABLET | Freq: Two times a day (BID) | ORAL | 0 refills | Status: DC
Start: 2020-03-19 — End: 2020-11-09

## 2020-03-19 NOTE — Progress Notes (Signed)
We are sorry that you are not feeling well.  Here is how we plan to help!  Based on your presentation I believe you most likely have A cough due to bacteria.  When patients have a fever and a productive cough with a change in color or increased sputum production, we are concerned about bacterial bronchitis.  If left untreated it can progress to pneumonia.  If your symptoms do not improve with your treatment plan it is important that you contact your provider.   I have prescribed Doxycycline 100 mg twice a day for 7 days     In addition you may use A non-prescription cough medication called Mucinex DM: take 2 tablets every 12 hours.   From your responses in the eVisit questionnaire you describe inflammation in the upper respiratory tract which is causing a significant cough.  This is commonly called Bronchitis and has four common causes:    Allergies  Viral Infections  Acid Reflux  Bacterial Infection Allergies, viruses and acid reflux are treated by controlling symptoms or eliminating the cause. An example might be a cough caused by taking certain blood pressure medications. You stop the cough by changing the medication. Another example might be a cough caused by acid reflux. Controlling the reflux helps control the cough.  USE OF BRONCHODILATOR ("RESCUE") INHALERS: There is a risk from using your bronchodilator too frequently.  The risk is that over-reliance on a medication which only relaxes the muscles surrounding the breathing tubes can reduce the effectiveness of medications prescribed to reduce swelling and congestion of the tubes themselves.  Although you feel brief relief from the bronchodilator inhaler, your asthma may actually be worsening with the tubes becoming more swollen and filled with mucus.  This can delay other crucial treatments, such as oral steroid medications. If you need to use a bronchodilator inhaler daily, several times per day, you should discuss this with your  provider.  There are probably better treatments that could be used to keep your asthma under control.     HOME CARE . Only take medications as instructed by your medical team. . Complete the entire course of an antibiotic. . Drink plenty of fluids and get plenty of rest. . Avoid close contacts especially the very young and the elderly . Cover your mouth if you cough or cough into your sleeve. . Always remember to wash your hands . A steam or ultrasonic humidifier can help congestion.   GET HELP RIGHT AWAY IF: . You develop worsening fever. . You become short of breath . You cough up blood. . Your symptoms persist after you have completed your treatment plan MAKE SURE YOU   Understand these instructions.  Will watch your condition.  Will get help right away if you are not doing well or get worse.  Your e-visit answers were reviewed by a board certified advanced clinical practitioner to complete your personal care plan.  Depending on the condition, your plan could have included both over the counter or prescription medications. If there is a problem please reply  once you have received a response from your provider. Your safety is important to us.  If you have drug allergies check your prescription carefully.    You can use MyChart to ask questions about today's visit, request a non-urgent call back, or ask for a work or school excuse for 24 hours related to this e-Visit. If it has been greater than 24 hours you will need to follow up with your provider,   or enter a new e-Visit to address those concerns. You will get an e-mail in the next two days asking about your experience.  I hope that your e-visit has been valuable and will speed your recovery. Thank you for using e-visits.  5-10 minutes spent reviewing and documenting in chart.   

## 2020-06-06 ENCOUNTER — Other Ambulatory Visit (HOSPITAL_COMMUNITY): Payer: Self-pay | Admitting: Dermatology

## 2020-06-06 MED FILL — TRETINOIN 0.025% CREAM: 0.025 | 30 days supply | Qty: 20 | Fill #0

## 2020-11-09 ENCOUNTER — Telehealth: Payer: PRIVATE HEALTH INSURANCE | Admitting: Physician Assistant

## 2020-11-09 DIAGNOSIS — J019 Acute sinusitis, unspecified: Secondary | ICD-10-CM

## 2020-11-09 DIAGNOSIS — B9689 Other specified bacterial agents as the cause of diseases classified elsewhere: Secondary | ICD-10-CM

## 2020-11-09 MED ORDER — AMOXICILLIN-POT CLAVULANATE 875-125 MG PO TABS: 1.0000 | ORAL_TABLET | Freq: Two times a day (BID) | ORAL | 0 refills | Status: DC

## 2020-11-09 NOTE — Progress Notes (Signed)

## 2020-11-09 NOTE — Progress Notes (Signed)
I have spent 5 minutes in review of e-visit questionnaire, review and updating patient chart, medical decision making and response to patient.   Jeremia Groot Cody Zoeya Gramajo, PA-C    

## 2021-04-12 ENCOUNTER — Telehealth: Payer: PRIVATE HEALTH INSURANCE | Admitting: Physician Assistant

## 2021-04-12 DIAGNOSIS — R399 Unspecified symptoms and signs involving the genitourinary system: Secondary | ICD-10-CM

## 2021-04-12 MED ORDER — CEPHALEXIN 500 MG PO CAPS: 500.0000 mg | ORAL_CAPSULE | Freq: Two times a day (BID) | ORAL | 0 refills | Status: AC

## 2021-04-12 NOTE — Progress Notes (Signed)
I have spent 5 minutes in review of e-visit questionnaire, review and updating patient chart, medical decision making and response to patient.   Anthonyjames Bargar Cody Jedi Catalfamo, PA-C    

## 2021-04-12 NOTE — Progress Notes (Signed)

## 2021-07-27 DIAGNOSIS — N920 Excessive and frequent menstruation with regular cycle: Secondary | ICD-10-CM | POA: Diagnosis not present

## 2021-07-27 DIAGNOSIS — Z1231 Encounter for screening mammogram for malignant neoplasm of breast: Secondary | ICD-10-CM | POA: Diagnosis not present

## 2021-07-27 DIAGNOSIS — Z01411 Encounter for gynecological examination (general) (routine) with abnormal findings: Secondary | ICD-10-CM | POA: Diagnosis not present

## 2021-07-27 DIAGNOSIS — Z01419 Encounter for gynecological examination (general) (routine) without abnormal findings: Secondary | ICD-10-CM | POA: Diagnosis not present

## 2021-07-27 DIAGNOSIS — R69 Illness, unspecified: Secondary | ICD-10-CM | POA: Diagnosis not present

## 2021-07-27 DIAGNOSIS — Z124 Encounter for screening for malignant neoplasm of cervix: Secondary | ICD-10-CM | POA: Diagnosis not present

## 2021-07-27 DIAGNOSIS — Z6827 Body mass index (BMI) 27.0-27.9, adult: Secondary | ICD-10-CM | POA: Diagnosis not present

## 2021-07-27 DIAGNOSIS — Z842 Family history of other diseases of the genitourinary system: Secondary | ICD-10-CM | POA: Diagnosis not present

## 2021-08-07 LAB — HM PAP SMEAR

## 2021-09-25 ENCOUNTER — Telehealth: Payer: PRIVATE HEALTH INSURANCE | Admitting: Physician Assistant

## 2021-09-25 DIAGNOSIS — J069 Acute upper respiratory infection, unspecified: Secondary | ICD-10-CM

## 2021-09-25 MED ORDER — IPRATROPIUM BROMIDE 0.03 % NA SOLN: 2.0000 | Freq: Two times a day (BID) | NASAL | 0 refills | Status: DC

## 2021-09-25 MED ORDER — PSEUDOEPH-BROMPHEN-DM 30-2-10 MG/5ML PO SYRP: 5.0000 mL | ORAL_SOLUTION | Freq: Four times a day (QID) | ORAL | 0 refills | Status: DC | PRN

## 2021-09-25 MED ORDER — BENZONATATE 100 MG PO CAPS: 100.0000 mg | ORAL_CAPSULE | Freq: Three times a day (TID) | ORAL | 0 refills | Status: DC | PRN

## 2021-09-25 NOTE — Progress Notes (Signed)
E-Visit for Upper Respiratory Infection   We are sorry you are not feeling well.  Here is how we plan to help!  Based on what you have shared with me, it looks like you may have a viral upper respiratory infection.  Upper respiratory infections are caused by a large number of viruses; however, rhinovirus is the most common cause.   Symptoms vary from person to person, with common symptoms including sore throat, cough, fatigue or lack of energy and feeling of general discomfort.  A low-grade fever of up to 100.4 may present, but is often uncommon.  Symptoms vary however, and are closely related to a person's age or underlying illnesses.  The most common symptoms associated with an upper respiratory infection are nasal discharge or congestion, cough, sneezing, headache and pressure in the ears and face.  These symptoms usually persist for about 3 to 10 days, but can last up to 2 weeks.  It is important to know that upper respiratory infections do not cause serious illness or complications in most cases.    Upper respiratory infections can be transmitted from person to person, with the most common method of transmission being a person's hands.  The virus is able to live on the skin and can infect other persons for up to 2 hours after direct contact.  Also, these can be transmitted when someone coughs or sneezes; thus, it is important to cover the mouth to reduce this risk.  To keep the spread of the illness at [place], good hand hygiene is very important.  This is an infection that is most likely caused by a virus. There are no specific treatments other than to help you with the symptoms until the infection runs its course.  We are sorry you are not feeling well.  Here is how we plan to help!   For nasal congestion, you may use an oral decongestants such as Mucinex D or if you have glaucoma or high blood pressure use plain Mucinex.  Saline nasal spray or nasal drops can help and can safely be used as often as  needed for congestion.  For your congestion, I have prescribed Ipratropium Bromide nasal spray 0.03% two sprays in each nostril 2-3 times a day  If you do not have a history of heart disease, hypertension, diabetes or thyroid disease, prostate/bladder issues or glaucoma, you may also use Sudafed to treat nasal congestion.  It is highly recommended that you consult with a pharmacist or your primary care physician to ensure this medication is safe for you to take.     If you have a cough, you may use cough suppressants such as Delsym and Robitussin.  If you have glaucoma or high blood pressure, you can also use Coricidin HBP.   For cough I have prescribed for you A prescription cough medication called Tessalon Perles 100 mg. You may take 1-2 capsules every 8 hours as needed for cough and Bromfed DM cough syrup 27m every 6 hours as needed for cough.   If you have a sore or scratchy throat, use a saltwater gargle-  to  teaspoon of salt dissolved in a 4-ounce to 8-ounce glass of warm water.  Gargle the solution for approximately 15-30 seconds and then spit.  It is important not to swallow the solution.  You can also use throat lozenges/cough drops and Chloraseptic spray to help with throat pain or discomfort.  Warm or cold liquids can also be helpful in relieving throat pain.  For headache,  pain or general discomfort, you can use Ibuprofen or Tylenol as directed.   Some authorities believe that zinc sprays or the use of Echinacea may shorten the course of your symptoms.   HOME CARE Only take medications as instructed by your medical team. Be sure to drink plenty of fluids. Water is fine as well as fruit juices, sodas and electrolyte beverages. You may want to stay away from caffeine or alcohol. If you are nauseated, try taking small sips of liquids. How do you know if you are getting enough fluid? Your urine should be a pale yellow or almost colorless. Get rest. Taking a steamy shower or using a  humidifier may help nasal congestion and ease sore throat pain. You can place a towel over your head and breathe in the steam from hot water coming from a faucet. Using a saline nasal spray works much the same way. Cough drops, hard candies and sore throat lozenges may ease your cough. Avoid close contacts especially the very young and the elderly Cover your mouth if you cough or sneeze Always remember to wash your hands.   GET HELP RIGHT AWAY IF: You develop worsening fever. If your symptoms do not improve within 10 days You develop yellow or green discharge from your nose over 3 days. You have coughing fits You develop a severe head ache or visual changes. You develop shortness of breath, difficulty breathing or start having chest pain Your symptoms persist after you have completed your treatment plan  MAKE SURE YOU  Understand these instructions. Will watch your condition. Will get help right away if you are not doing well or get worse.  Thank you for choosing an e-visit.  Your e-visit answers were reviewed by a board certified advanced clinical practitioner to complete your personal care plan. Depending upon the condition, your plan could have included both over the counter or prescription medications.  Please review your pharmacy choice. Make sure the pharmacy is open so you can pick up prescription now. If there is a problem, you may contact your provider through [employer] and have the prescription routed to another pharmacy.  Your safety is important to [place]. If you have drug allergies check your prescription carefully.   For the next 24 hours you can use MyChart to ask questions about today's visit, request a non-urgent call back, or ask for a work or school excuse. You will get an email in the next two days asking about your experience. I hope that your e-visit has been valuable and will speed your recovery.    I provided 5 minutes of non face-to-face time during this  encounter for chart review and documentation.

## 2021-09-26 DIAGNOSIS — Z20828 Contact with and (suspected) exposure to other viral communicable diseases: Secondary | ICD-10-CM | POA: Diagnosis not present

## 2021-09-26 DIAGNOSIS — J029 Acute pharyngitis, unspecified: Secondary | ICD-10-CM | POA: Diagnosis not present

## 2021-09-26 DIAGNOSIS — H66001 Acute suppurative otitis media without spontaneous rupture of ear drum, right ear: Secondary | ICD-10-CM | POA: Diagnosis not present

## 2021-10-11 DIAGNOSIS — J0140 Acute pansinusitis, unspecified: Secondary | ICD-10-CM | POA: Diagnosis not present

## 2021-10-11 DIAGNOSIS — R0981 Nasal congestion: Secondary | ICD-10-CM | POA: Diagnosis not present

## 2021-10-23 DIAGNOSIS — N920 Excessive and frequent menstruation with regular cycle: Secondary | ICD-10-CM | POA: Diagnosis not present

## 2021-10-23 DIAGNOSIS — Z8041 Family history of malignant neoplasm of ovary: Secondary | ICD-10-CM | POA: Diagnosis not present

## 2021-10-24 DIAGNOSIS — J3489 Other specified disorders of nose and nasal sinuses: Secondary | ICD-10-CM | POA: Diagnosis not present

## 2021-11-22 ENCOUNTER — Encounter: Payer: Self-pay | Admitting: Family Medicine

## 2021-11-22 ENCOUNTER — Ambulatory Visit (INDEPENDENT_AMBULATORY_CARE_PROVIDER_SITE_OTHER): Payer: 59 | Admitting: Family Medicine

## 2021-11-22 VITALS — BP 120/82 | HR 94 | Temp 97.5°F | Resp 18 | Ht 67.0 in | Wt 179.0 lb

## 2021-11-22 DIAGNOSIS — G43909 Migraine, unspecified, not intractable, without status migrainosus: Secondary | ICD-10-CM | POA: Diagnosis not present

## 2021-11-22 DIAGNOSIS — N926 Irregular menstruation, unspecified: Secondary | ICD-10-CM | POA: Diagnosis not present

## 2021-11-22 DIAGNOSIS — E663 Overweight: Secondary | ICD-10-CM | POA: Diagnosis not present

## 2021-11-22 DIAGNOSIS — R69 Illness, unspecified: Secondary | ICD-10-CM | POA: Diagnosis not present

## 2021-11-22 DIAGNOSIS — I83811 Varicose veins of right lower extremities with pain: Secondary | ICD-10-CM | POA: Diagnosis not present

## 2021-11-22 DIAGNOSIS — F339 Major depressive disorder, recurrent, unspecified: Secondary | ICD-10-CM | POA: Diagnosis not present

## 2021-11-22 NOTE — Assessment & Plan Note (Signed)
New to provider, recurrent problem for pt.  Given her discomfort, will refer to [facility] for complete evaluation and tx

## 2021-11-22 NOTE — Patient Instructions (Addendum)
Schedule your complete physical in 6 months No need to repeat labs today- yay! We'll call you to schedule your vascular appt for the varicose veins Keep up the good work on healthy diet and regular exercise- you're doing great!!! Call with any questions or concerns Stay Safe!  Stay Healthy! Welcome!  We're glad to have you!!

## 2021-11-22 NOTE — Assessment & Plan Note (Signed)
New to provider, ongoing for pt.  She reports she has recently resumed running after a few years off.  Is currently exercising 3x/week.  Applauded her efforts and encouraged her to continue.

## 2021-11-22 NOTE — Assessment & Plan Note (Signed)
New to provider, ongoing for pt.  Was started on Adderall and Lamictal by Dr Roderick Pee in [place].  Pt is not sure whether she carries a dx of ADHD or not.  She stopped her Lamictal in late May and would like to remain off it if things remain stable.  Not clear as to why pt is not on traditional SSRI or SNRI.  Will continue to follow.

## 2021-11-22 NOTE — Progress Notes (Signed)
   Subjective:    Patient ID: Gerhard Perches, female    DOB: 08/12/1980, 41 y.o.   MRN: [id number]  HPI New to establish.  Previous MD- [facility], [place].  GYN- [facility].  Depression- pt reports she was 'all over the place'.  Had post-partum w/ both kids.  Was started on Adderall.  Currently on Adderall XR '30mg'$  daily- prescribed by Roderick Pee.  Was previously on Lamictal '100mg'$  but was able to stop this in late May.  Abnormal periods- was recently started on Progesterone by Dr Benjie Karvonen.  Pt is taking x10 days and then to allow a withdrawal bleed.  Migraines- pt has Maxalt to use as needed.  Reports she has not had many migraines recently.    Overweight- pt's BMI 28.04.  Pt is starting to run again after a few years off.  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   Review of Systems For ROS see HPI     Objective:   Physical Exam Vitals reviewed.  Constitutional:      General: She is not in acute distress.    Appearance: Normal appearance. She is well-developed. She is not ill-appearing.  HENT:     Head: Normocephalic and atraumatic.  Eyes:     Conjunctiva/sclera: Conjunctivae normal.     Pupils: Pupils are equal, round, and reactive to light.  Neck:     Thyroid: No thyromegaly.  Cardiovascular:     Rate and Rhythm: Normal rate and regular rhythm.     Pulses: Normal pulses.     Heart sounds: Normal heart sounds. No murmur heard. Pulmonary:     Effort: Pulmonary effort is normal. No respiratory distress.     Breath sounds: Normal breath sounds.  Abdominal:     General: There is no distension.     Palpations: Abdomen is soft.     Tenderness: There is no abdominal tenderness.  Musculoskeletal:     Cervical back: Normal range of motion and neck supple.     Right lower leg: No edema.     Left lower leg: No edema.  Lymphadenopathy:     Cervical: No cervical adenopathy.   Skin:    General: Skin is warm and dry.  Neurological:     General: No focal deficit present.     Mental Status: She is alert and oriented to person, place, and time.  Psychiatric:        Mood and Affect: Mood normal.        Behavior: Behavior normal.           Assessment & Plan:

## 2021-11-22 NOTE — Assessment & Plan Note (Signed)
New to provider, ongoing for pt.  Following w/ Dr Benjie Karvonen- was recently started on Progesterone to regulate periods.  Reports that the first time she took it (last month) she felt very disconnected and strange.  Encouraged her to try again this month to see if her body has adjusted, and if not, she needs to contact Dr Benjie Karvonen.  Pt expressed understanding and is in agreement w/ plan.

## 2021-11-22 NOTE — Assessment & Plan Note (Signed)
New to provider, ongoing for pt.  Thankfully she reports she has not had many migraines recently and she will take Maxalt prn.  Will refill prn.

## 2022-01-16 ENCOUNTER — Telehealth: Payer: 59 | Admitting: Physician Assistant

## 2022-01-16 DIAGNOSIS — J019 Acute sinusitis, unspecified: Secondary | ICD-10-CM | POA: Diagnosis not present

## 2022-01-16 DIAGNOSIS — B9689 Other specified bacterial agents as the cause of diseases classified elsewhere: Secondary | ICD-10-CM | POA: Diagnosis not present

## 2022-01-16 MED ORDER — BENZONATATE 100 MG PO CAPS: 100.0000 mg | ORAL_CAPSULE | Freq: Three times a day (TID) | ORAL | 0 refills | Status: DC | PRN

## 2022-01-16 MED ORDER — DOXYCYCLINE HYCLATE 100 MG PO TABS: 100.0000 mg | ORAL_TABLET | Freq: Two times a day (BID) | ORAL | 0 refills | Status: DC

## 2022-01-16 NOTE — Progress Notes (Signed)
E-Visit for Sinus Problems  We are sorry that you are not feeling well.  Here is how we plan to help!  Based on what you have shared with me it looks like you have sinusitis.  Sinusitis is inflammation and infection in the sinus cavities of the head.  Based on your presentation I believe you most likely have Acute Bacterial Sinusitis.  This is an infection caused by bacteria and is treated with antibiotics. I have prescribed Doxycycline '100mg'$  by mouth twice a day for 10 days. You may use an oral decongestant such as Mucinex D or if you have glaucoma or high blood pressure use plain Mucinex. Saline nasal spray help and can safely be used as often as needed for congestion.  If you develop worsening sinus pain, fever or notice severe headache and vision changes, or if symptoms are not better after completion of antibiotic, please schedule an appointment with a health care provider.    I have also sent in a prescription cough medication to take as directed.   Sinus infections are not as easily transmitted as other respiratory infection, however we still recommend that you avoid close contact with loved ones, especially the very young and elderly.  Remember to wash your hands thoroughly throughout the day as this is the number one way to prevent the spread of infection!  Home Care: Only take medications as instructed by your medical team. Complete the entire course of an antibiotic. Do not take these medications with alcohol. A steam or ultrasonic humidifier can help congestion.  You can place a towel over your head and breathe in the steam from hot water coming from a faucet. Avoid close contacts especially the very young and the elderly. Cover your mouth when you cough or sneeze. Always remember to wash your hands.  Get Help Right Away If: You develop worsening fever or sinus pain. You develop a severe head ache or visual changes. Your symptoms persist after you have completed your treatment  plan.  Make sure you Understand these instructions. Will watch your condition. Will get help right away if you are not doing well or get worse.  Thank you for choosing an e-visit.  Your e-visit answers were reviewed by a board certified advanced clinical practitioner to complete your personal care plan. Depending upon the condition, your plan could have included both over the counter or prescription medications.  Please review your pharmacy choice. Make sure the pharmacy is open so you can pick up prescription now. If there is a problem, you may contact your provider through [employer] and have the prescription routed to another pharmacy.  Your safety is important to [place]. If you have drug allergies check your prescription carefully.   For the next 24 hours you can use MyChart to ask questions about today's visit, request a non-urgent call back, or ask for a work or school excuse. You will get an email in the next two days asking about your experience. I hope that your e-visit has been valuable and will speed your recovery.

## 2022-01-16 NOTE — Progress Notes (Signed)
I have spent 5 minutes in review of e-visit questionnaire, review and updating patient chart, medical decision making and response to patient.   Billie Intriago Cody Leoni Goodness, PA-C    

## 2022-05-16 ENCOUNTER — Encounter: Payer: PRIVATE HEALTH INSURANCE | Admitting: Family Medicine

## 2022-05-29 ENCOUNTER — Encounter: Payer: PRIVATE HEALTH INSURANCE | Admitting: Family Medicine

## 2022-07-09 DIAGNOSIS — Z681 Body mass index (BMI) 19 or less, adult: Secondary | ICD-10-CM | POA: Diagnosis not present

## 2022-07-09 DIAGNOSIS — J020 Streptococcal pharyngitis: Secondary | ICD-10-CM | POA: Diagnosis not present

## 2022-07-09 DIAGNOSIS — R030 Elevated blood-pressure reading, without diagnosis of hypertension: Secondary | ICD-10-CM | POA: Diagnosis not present

## 2022-07-09 DIAGNOSIS — J029 Acute pharyngitis, unspecified: Secondary | ICD-10-CM | POA: Diagnosis not present

## 2022-07-13 DIAGNOSIS — B3731 Acute candidiasis of vulva and vagina: Secondary | ICD-10-CM | POA: Diagnosis not present

## 2022-07-13 DIAGNOSIS — Z6828 Body mass index (BMI) 28.0-28.9, adult: Secondary | ICD-10-CM | POA: Diagnosis not present

## 2022-07-13 DIAGNOSIS — J020 Streptococcal pharyngitis: Secondary | ICD-10-CM | POA: Diagnosis not present

## 2022-07-13 DIAGNOSIS — H66003 Acute suppurative otitis media without spontaneous rupture of ear drum, bilateral: Secondary | ICD-10-CM | POA: Diagnosis not present

## 2022-07-18 DIAGNOSIS — M25551 Pain in right hip: Secondary | ICD-10-CM | POA: Diagnosis not present

## 2022-07-18 DIAGNOSIS — M25561 Pain in right knee: Secondary | ICD-10-CM | POA: Diagnosis not present

## 2022-07-26 DIAGNOSIS — M25551 Pain in right hip: Secondary | ICD-10-CM | POA: Diagnosis not present

## 2022-07-26 DIAGNOSIS — M25561 Pain in right knee: Secondary | ICD-10-CM | POA: Diagnosis not present

## 2022-07-31 DIAGNOSIS — Z01419 Encounter for gynecological examination (general) (routine) without abnormal findings: Secondary | ICD-10-CM | POA: Diagnosis not present

## 2022-07-31 DIAGNOSIS — M25551 Pain in right hip: Secondary | ICD-10-CM | POA: Diagnosis not present

## 2022-07-31 DIAGNOSIS — M25561 Pain in right knee: Secondary | ICD-10-CM | POA: Diagnosis not present

## 2022-08-07 DIAGNOSIS — M25551 Pain in right hip: Secondary | ICD-10-CM | POA: Diagnosis not present

## 2022-08-07 DIAGNOSIS — M25561 Pain in right knee: Secondary | ICD-10-CM | POA: Diagnosis not present

## 2022-08-14 ENCOUNTER — Ambulatory Visit (INDEPENDENT_AMBULATORY_CARE_PROVIDER_SITE_OTHER): Payer: 59 | Admitting: Family Medicine

## 2022-08-14 ENCOUNTER — Encounter: Payer: Self-pay | Admitting: Family Medicine

## 2022-08-14 VITALS — BP 126/84 | HR 97 | Temp 98.0°F | Resp 16 | Ht 67.0 in | Wt 181.2 lb

## 2022-08-14 DIAGNOSIS — Z0000 Encounter for general adult medical examination without abnormal findings: Secondary | ICD-10-CM

## 2022-08-14 DIAGNOSIS — E663 Overweight: Secondary | ICD-10-CM

## 2022-08-14 DIAGNOSIS — Z832 Family history of diseases of the blood and blood-forming organs and certain disorders involving the immune mechanism: Secondary | ICD-10-CM

## 2022-08-14 LAB — CBC WITH DIFFERENTIAL/PLATELET
Basophils Absolute: 0 10*3/uL (ref 0.0–0.1)
Basophils Relative: 0.7 % (ref 0.0–3.0)
Eosinophils Absolute: 0 10*3/uL (ref 0.0–0.7)
Eosinophils Relative: 0.7 % (ref 0.0–5.0)
HCT: 38.5 % (ref 36.0–46.0)
Hemoglobin: 13.5 g/dL (ref 12.0–15.0)
Lymphocytes Relative: 32.4 % (ref 12.0–46.0)
Lymphs Abs: 1.9 10*3/uL (ref 0.7–4.0)
MCHC: 35.1 g/dL (ref 30.0–36.0)
MCV: 87.4 fl (ref 78.0–100.0)
Monocytes Absolute: 0.5 10*3/uL (ref 0.1–1.0)
Monocytes Relative: 7.6 % (ref 3.0–12.0)
Neutro Abs: 3.5 10*3/uL (ref 1.4–7.7)
Neutrophils Relative %: 58.6 % (ref 43.0–77.0)
Platelets: 318 10*3/uL (ref 150.0–400.0)
RBC: 4.41 Mil/uL (ref 3.87–5.11)
RDW: 13.3 % (ref 11.5–15.5)
WBC: 6 10*3/uL (ref 4.0–10.5)

## 2022-08-14 LAB — LIPID PANEL
Cholesterol: 159 mg/dL (ref 0–200)
HDL: 55.9 mg/dL (ref >39.00)
LDL Cholesterol: 95 mg/dL (ref 0–99)
NonHDL: 103.37
Total CHOL/HDL Ratio: 3
Triglycerides: 44 mg/dL (ref 0.0–149.0)
VLDL: 8.8 mg/dL (ref 0.0–40.0)

## 2022-08-14 LAB — BASIC METABOLIC PANEL
BUN: 16 mg/dL (ref 6–23)
CO2: 27 mEq/L (ref 19–32)
Calcium: 9.3 mg/dL (ref 8.4–10.5)
Chloride: 104 mEq/L (ref 96–112)
Creatinine, Ser: 0.68 mg/dL (ref 0.40–1.20)
GFR: 107.56 mL/min (ref >60.00)
Glucose, Bld: 97 mg/dL (ref 70–99)
Potassium: 4.4 mEq/L (ref 3.5–5.1)
Sodium: 137 mEq/L (ref 135–145)

## 2022-08-14 LAB — HEPATIC FUNCTION PANEL
ALT: 12 U/L (ref 0–35)
AST: 15 U/L (ref 0–37)
Albumin: 4.5 g/dL (ref 3.5–5.2)
Alkaline Phosphatase: 50 U/L (ref 39–117)
Bilirubin, Direct: 0.1 mg/dL (ref 0.0–0.3)
Total Bilirubin: 0.6 mg/dL (ref 0.2–1.2)
Total Protein: 6.6 g/dL (ref 6.0–8.3)

## 2022-08-14 LAB — VITAMIN D 25 HYDROXY (VIT D DEFICIENCY, FRACTURES): VITD: 42.02 ng/mL (ref 30.00–100.00)

## 2022-08-14 LAB — TSH: TSH: 1.42 u[IU]/mL (ref 0.35–5.50)

## 2022-08-14 NOTE — Assessment & Plan Note (Signed)
New.  Sister developed multiple PE when she took OCPs.  GYN wants to start her on OCPs for abnormal bleeding but she is fearful b/c her sister never got a definitive dx.  Will do hypercoagulable panel and determine if pt is also at risk.  Pt expressed understanding and is in agreement w/ plan.

## 2022-08-14 NOTE — Assessment & Plan Note (Signed)
Check labs to risk stratify.  Will follow. 

## 2022-08-14 NOTE — Patient Instructions (Addendum)
Follow up in 1 year or as needed We'll notify you of your lab results and make any changes if needed Keep up the good work on healthy diet and regular exercise- you look great! Call [facility] at [phone] about the varicose veins Either double the Claritin or add a Zyrtec to help w/ congestion or drainage Phenylephrine to help dry up the fluid in the ear Call with any questions or concerns Stay Safe!  Stay Healthy! Happy Spring!!!

## 2022-08-14 NOTE — Progress Notes (Signed)
   Subjective:    Patient ID: Lacey Franklin, female    DOB: [birth year], 42 y.o.   MRN: [id number]  HPI CPE- UTD on pap, Tdap.  Pt prefers to wait on mammogram  Patient Care Team    Relationship Specialty Notifications Start End  Maurice Small, MD PCP - General Family Medicine  04/19/15      Health Maintenance  Topic Date Due   Hepatitis C Screening  11/23/2022 (Originally 05/18/1998)   INFLUENZA VACCINE  11/29/2022   PAP SMEAR-Modifier  09/07/2024   DTaP/Tdap/Td (3 - Td or Tdap) 07/13/2027   HIV Screening  Completed   HPV VACCINES  Aged Out   COVID-19 Vaccine  Discontinued     Review of Systems Patient reports no vision/ hearing changes, adenopathy,fever, weight change,  persistant/recurrent hoarseness , swallowing issues, chest pain, palpitations, edema, persistant/recurrent cough, hemoptysis, dyspnea (rest/exertional/paroxysmal nocturnal), gastrointestinal bleeding (melena, rectal bleeding), abdominal pain, significant heartburn, bowel changes, GU symptoms (dysuria, hematuria, incontinence), Gyn symptoms (abnormal  bleeding, pain),  syncope, focal weakness, memory loss, numbness & tingling, skin/hair/nail changes, abnormal bruising or bleeding, anxiety, or depression.     Objective:   Physical Exam General Appearance:    Alert, cooperative, no distress, appears stated age  Head:    Normocephalic, without obvious abnormality, atraumatic  Eyes:    PERRL, conjunctiva/corneas clear, EOM's intact both eyes  Ears:    Normal TM's and external ear canals, both ears  Nose:   Nares normal, septum midline, mucosa normal, no drainage    or sinus tenderness  Throat:   Lips, mucosa, and tongue normal; teeth and gums normal  Neck:   Supple, symmetrical, trachea midline, no adenopathy;    Thyroid: no enlargement/tenderness/nodules  Back:     Symmetric, no curvature, ROM normal, no CVA tenderness  Lungs:     Clear to auscultation bilaterally, respirations unlabored  Chest Wall:    No  tenderness or deformity   Heart:    Regular rate and rhythm, S1 and S2 normal, no murmur, rub   or gallop  Breast Exam:    Deferred to GYN  Abdomen:     Soft, non-tender, bowel sounds active all four quadrants,    no masses, no organomegaly  Genitalia:    Deferred to GYN  Rectal:    Extremities:   Extremities normal, atraumatic, no cyanosis or edema  Pulses:   2+ and symmetric all extremities  Skin:   Skin color, texture, turgor normal, no rashes or lesions  Lymph nodes:   Cervical, supraclavicular, and axillary nodes normal  Neurologic:   CNII-XII intact, normal strength, sensation and reflexes    throughout          Assessment & Plan:

## 2022-08-14 NOTE — Assessment & Plan Note (Signed)
Pt's PE WNL w/ exception of BMI.  GYN recently started her on Phentermine to help w/ weight loss.  UTD on pap, wants to wait on mammogram.  Check labs.  Anticipatory guidance provided.

## 2022-08-15 ENCOUNTER — Telehealth: Payer: Self-pay

## 2022-08-15 NOTE — Telephone Encounter (Signed)
Pt aware of lab results . She states she can not have the clotting test done it is $ 2,000 out of pocket and insurance does not cover that

## 2022-08-15 NOTE — Telephone Encounter (Signed)
-----   Message from Sheliah Hatch, MD sent at 08/15/2022  7:30 AM EDT ----- Labs look great!  No changes at this time.  Waiting on clotting disorder labs.

## 2022-08-15 NOTE — Telephone Encounter (Signed)
Test canceled.

## 2022-08-15 NOTE — Telephone Encounter (Signed)
Noted.  Please cancel the hypercoagulable panel for this patient

## 2022-08-15 NOTE — Addendum Note (Signed)
Addended by: Eldred Manges on: 08/15/2022 09:51 AM   Modules accepted: Orders

## 2022-08-21 DIAGNOSIS — M25551 Pain in right hip: Secondary | ICD-10-CM | POA: Diagnosis not present

## 2022-08-21 DIAGNOSIS — M25561 Pain in right knee: Secondary | ICD-10-CM | POA: Diagnosis not present

## 2022-08-28 DIAGNOSIS — R5383 Other fatigue: Secondary | ICD-10-CM | POA: Diagnosis not present

## 2022-10-23 DIAGNOSIS — E669 Obesity, unspecified: Secondary | ICD-10-CM | POA: Diagnosis not present

## 2022-10-23 DIAGNOSIS — R5383 Other fatigue: Secondary | ICD-10-CM | POA: Diagnosis not present

## 2022-10-23 DIAGNOSIS — Z6827 Body mass index (BMI) 27.0-27.9, adult: Secondary | ICD-10-CM | POA: Diagnosis not present

## 2022-12-20 ENCOUNTER — Other Ambulatory Visit: Payer: Self-pay

## 2022-12-20 DIAGNOSIS — I8390 Asymptomatic varicose veins of unspecified lower extremity: Secondary | ICD-10-CM

## 2023-01-01 ENCOUNTER — Ambulatory Visit (HOSPITAL_COMMUNITY): Payer: 59

## 2023-01-29 DIAGNOSIS — R891 Abnormal level of hormones in specimens from other organs, systems and tissues: Secondary | ICD-10-CM | POA: Diagnosis not present

## 2023-02-19 DIAGNOSIS — N943 Premenstrual tension syndrome: Secondary | ICD-10-CM | POA: Diagnosis not present

## 2023-02-19 DIAGNOSIS — E669 Obesity, unspecified: Secondary | ICD-10-CM | POA: Diagnosis not present

## 2023-02-19 DIAGNOSIS — N920 Excessive and frequent menstruation with regular cycle: Secondary | ICD-10-CM | POA: Diagnosis not present

## 2023-02-19 DIAGNOSIS — Z6827 Body mass index (BMI) 27.0-27.9, adult: Secondary | ICD-10-CM | POA: Diagnosis not present

## 2023-02-25 ENCOUNTER — Other Ambulatory Visit (HOSPITAL_COMMUNITY): Payer: Self-pay

## 2023-02-25 MED ORDER — INFLUENZA VIRUS VACC SPLIT PF (FLUZONE) 0.5 ML IM SUSY
0.5000 mL | PREFILLED_SYRINGE | Freq: Once | INTRAMUSCULAR | 0 refills | Status: AC
  Filled 2023-02-25: qty 0.5, 1d supply, fill #0

## 2023-04-14 ENCOUNTER — Telehealth: Payer: 59 | Admitting: Family

## 2023-04-14 DIAGNOSIS — R6889 Other general symptoms and signs: Secondary | ICD-10-CM

## 2023-04-14 MED ORDER — OSELTAMIVIR PHOSPHATE 75 MG PO CAPS: 75.0000 mg | ORAL_CAPSULE | Freq: Two times a day (BID) | ORAL | 0 refills | Status: AC

## 2023-04-14 NOTE — Progress Notes (Signed)

## 2023-05-21 ENCOUNTER — Other Ambulatory Visit (HOSPITAL_COMMUNITY): Payer: Self-pay

## 2023-05-21 DIAGNOSIS — E669 Obesity, unspecified: Secondary | ICD-10-CM | POA: Diagnosis not present

## 2023-05-21 DIAGNOSIS — F900 Attention-deficit hyperactivity disorder, predominantly inattentive type: Secondary | ICD-10-CM | POA: Diagnosis not present

## 2023-05-21 DIAGNOSIS — Z6829 Body mass index (BMI) 29.0-29.9, adult: Secondary | ICD-10-CM | POA: Diagnosis not present

## 2023-05-21 MED ORDER — AMPHETAMINE-DEXTROAMPHETAMINE 20 MG PO TABS
20.0000 mg | ORAL_TABLET | Freq: Every day | ORAL | 0 refills | Status: AC
  Filled 2023-05-21: qty 30, 30d supply, fill #0

## 2023-05-22 ENCOUNTER — Other Ambulatory Visit (HOSPITAL_COMMUNITY): Payer: Self-pay

## 2023-07-08 ENCOUNTER — Other Ambulatory Visit (HOSPITAL_COMMUNITY): Payer: Self-pay

## 2023-07-08 ENCOUNTER — Telehealth: Payer: Self-pay | Admitting: Nurse Practitioner

## 2023-07-08 DIAGNOSIS — R0982 Postnasal drip: Secondary | ICD-10-CM

## 2023-07-08 DIAGNOSIS — J0140 Acute pansinusitis, unspecified: Secondary | ICD-10-CM

## 2023-07-08 DIAGNOSIS — R051 Acute cough: Secondary | ICD-10-CM

## 2023-07-08 MED ORDER — FLUTICASONE PROPIONATE 50 MCG/ACT NA SUSP
2.0000 | Freq: Every day | NASAL | 6 refills | Status: AC
  Filled 2023-07-08: qty 16, 30d supply, fill #0

## 2023-07-08 MED ORDER — BENZONATATE 100 MG PO CAPS
100.0000 mg | ORAL_CAPSULE | Freq: Three times a day (TID) | ORAL | 0 refills | Status: AC | PRN
  Filled 2023-07-08: qty 30, 10d supply, fill #0

## 2023-07-08 MED ORDER — DOXYCYCLINE HYCLATE 100 MG PO TABS
100.0000 mg | ORAL_TABLET | Freq: Two times a day (BID) | ORAL | 0 refills | Status: AC
Start: 2023-07-08 — End: 2023-07-15
  Filled 2023-07-08: qty 14, 7d supply, fill #0

## 2023-07-08 NOTE — Progress Notes (Signed)
 E-Visit for Sinus Problems  We are sorry that you are not feeling well.  Here is how we plan to help!  Based on what you have shared with me it looks like you have sinusitis.  Sinusitis is inflammation and infection in the sinus cavities of the head.  Based on your presentation I believe you most likely have Acute Bacterial Sinusitis.  This is an infection caused by bacteria and is treated with antibiotics. I have prescribed Doxycycline 100mg  by mouth twice a day for 10 days.   We also recommend: Meds ordered this encounter  Medications   doxycycline (VIBRA-TABS) 100 MG tablet    Sig: Take 1 tablet (100 mg total) by mouth 2 (two) times daily for 7 days.    Dispense:  14 tablet    Refill:  0   fluticasone (FLONASE) 50 MCG/ACT nasal spray    Sig: Place 2 sprays into both nostrils daily.    Dispense:  16 g    Refill:  6   benzonatate (TESSALON) 100 MG capsule    Sig: Take 1 capsule (100 mg total) by mouth 3 (three) times daily as needed.    Dispense:  30 capsule    Refill:  0     You may use an oral decongestant such as Mucinex D or if you have glaucoma or high blood pressure use plain Mucinex. Saline nasal spray help and can safely be used as often as needed for congestion.  If you develop worsening sinus pain, fever or notice severe headache and vision changes, or if symptoms are not better after completion of antibiotic, please schedule an appointment with a health care provider.    Sinus infections are not as easily transmitted as other respiratory infection, however we still recommend that you avoid close contact with loved ones, especially the very young and elderly.  Remember to wash your hands thoroughly throughout the day as this is the number one way to prevent the spread of infection!  Home Care: Only take medications as instructed by your medical team. Complete the entire course of an antibiotic. Do not take these medications with alcohol. A steam or ultrasonic humidifier can  help congestion.  You can place a towel over your head and breathe in the steam from hot water coming from a faucet. Avoid close contacts especially the very young and the elderly. Cover your mouth when you cough or sneeze. Always remember to wash your hands.  Get Help Right Away If: You develop worsening fever or sinus pain. You develop a severe head ache or visual changes. Your symptoms persist after you have completed your treatment plan.  Make sure you Understand these instructions. Will watch your condition. Will get help right away if you are not doing well or get worse.  Thank you for choosing an e-visit.  Your e-visit answers were reviewed by a board certified advanced clinical practitioner to complete your personal care plan. Depending upon the condition, your plan could have included both over the counter or prescription medications.  Please review your pharmacy choice. Make sure the pharmacy is open so you can pick up prescription now. If there is a problem, you may contact your provider through [employer] and have the prescription routed to another pharmacy.  Your safety is important to [place]. If you have drug allergies check your prescription carefully.   For the next 24 hours you can use MyChart to ask questions about today's visit, request a non-urgent call back, or ask for  a work or school excuse. You will get an email in the next two days asking about your experience. I hope that your e-visit has been valuable and will speed your recovery.   I spent approximately 5 minutes reviewing the patient's history, current symptoms and coordinating their care today.

## 2023-08-09 ENCOUNTER — Other Ambulatory Visit (HOSPITAL_COMMUNITY): Payer: Self-pay

## 2023-08-09 DIAGNOSIS — F900 Attention-deficit hyperactivity disorder, predominantly inattentive type: Secondary | ICD-10-CM | POA: Diagnosis not present

## 2023-08-09 MED ORDER — AMPHETAMINE-DEXTROAMPHET ER 30 MG PO CP24
30.0000 mg | ORAL_CAPSULE | Freq: Every morning | ORAL | 0 refills | Status: DC
  Filled 2023-08-09: qty 30, 30d supply, fill #0

## 2023-08-09 MED ORDER — AMPHETAMINE-DEXTROAMPHETAMINE 30 MG PO TABS
30.0000 mg | ORAL_TABLET | Freq: Every day | ORAL | 0 refills | Status: DC
Start: 2023-08-09 — End: 2023-09-09
  Filled 2023-08-09: qty 30, 30d supply, fill #0

## 2023-08-13 DIAGNOSIS — Z1331 Encounter for screening for depression: Secondary | ICD-10-CM | POA: Diagnosis not present

## 2023-08-13 DIAGNOSIS — Z01419 Encounter for gynecological examination (general) (routine) without abnormal findings: Secondary | ICD-10-CM | POA: Diagnosis not present

## 2023-08-13 DIAGNOSIS — Z1231 Encounter for screening mammogram for malignant neoplasm of breast: Secondary | ICD-10-CM | POA: Diagnosis not present

## 2023-09-06 DIAGNOSIS — F900 Attention-deficit hyperactivity disorder, predominantly inattentive type: Secondary | ICD-10-CM | POA: Diagnosis not present

## 2023-09-09 ENCOUNTER — Other Ambulatory Visit (HOSPITAL_COMMUNITY): Payer: Self-pay

## 2023-09-09 MED ORDER — AMPHETAMINE-DEXTROAMPHET ER 30 MG PO CP24
30.0000 mg | ORAL_CAPSULE | Freq: Every morning | ORAL | 0 refills | Status: AC
  Filled 2023-09-09: qty 30, 30d supply, fill #0

## 2023-09-09 MED ORDER — AMPHETAMINE-DEXTROAMPHET ER 30 MG PO CP24
30.0000 mg | ORAL_CAPSULE | Freq: Every morning | ORAL | 0 refills | Status: AC
  Filled 2023-12-03: qty 30, 30d supply, fill #0

## 2023-09-09 MED ORDER — AMPHETAMINE-DEXTROAMPHET ER 30 MG PO CP24: 30.0000 mg | ORAL_CAPSULE | Freq: Every morning | ORAL | 0 refills | Status: AC

## 2023-09-09 MED ORDER — AMPHETAMINE-DEXTROAMPHETAMINE 30 MG PO TABS
30.0000 mg | ORAL_TABLET | Freq: Every day | ORAL | 0 refills | Status: AC
Start: 2023-11-08 — End: ?
  Filled 2023-12-03: qty 30, 30d supply, fill #0

## 2023-09-09 MED ORDER — AMPHETAMINE-DEXTROAMPHETAMINE 30 MG PO TABS: 30.0000 mg | ORAL_TABLET | Freq: Every day | ORAL | 0 refills | Status: AC

## 2023-09-09 MED ORDER — AMPHETAMINE-DEXTROAMPHETAMINE 30 MG PO TABS
30.0000 mg | ORAL_TABLET | Freq: Every day | ORAL | 0 refills | Status: AC
  Filled 2023-09-09: qty 30, 30d supply, fill #0

## 2023-12-03 ENCOUNTER — Other Ambulatory Visit (HOSPITAL_COMMUNITY): Payer: Self-pay

## 2024-02-21 ENCOUNTER — Other Ambulatory Visit (HOSPITAL_COMMUNITY): Payer: Self-pay

## 2024-02-21 DIAGNOSIS — F900 Attention-deficit hyperactivity disorder, predominantly inattentive type: Secondary | ICD-10-CM | POA: Diagnosis not present

## 2024-02-21 MED ORDER — AMPHETAMINE-DEXTROAMPHET ER 30 MG PO CP24
30.0000 mg | ORAL_CAPSULE | Freq: Every morning | ORAL | 0 refills | Status: AC
  Filled 2024-02-21 – 2024-03-23 (×2): qty 30, 30d supply, fill #0

## 2024-02-21 MED ORDER — AMPHETAMINE-DEXTROAMPHETAMINE 30 MG PO TABS
30.0000 mg | ORAL_TABLET | Freq: Every day | ORAL | 0 refills | Status: AC
  Filled 2024-02-21: qty 30, 30d supply, fill #0

## 2024-02-21 MED ORDER — AMPHETAMINE-DEXTROAMPHETAMINE 30 MG PO TABS: 30.0000 mg | ORAL_TABLET | Freq: Every day | ORAL | 0 refills | Status: AC

## 2024-02-21 MED ORDER — AMPHETAMINE-DEXTROAMPHETAMINE 30 MG PO TABS
30.0000 mg | ORAL_TABLET | Freq: Every day | ORAL | 0 refills | Status: AC
  Filled 2024-04-27: qty 30, 30d supply, fill #0

## 2024-02-21 MED ORDER — AMPHETAMINE-DEXTROAMPHET ER 30 MG PO CP24: 30.0000 mg | ORAL_CAPSULE | Freq: Every morning | ORAL | 0 refills | Status: AC

## 2024-02-24 ENCOUNTER — Other Ambulatory Visit (HOSPITAL_COMMUNITY): Payer: Self-pay

## 2024-03-23 ENCOUNTER — Other Ambulatory Visit (HOSPITAL_COMMUNITY): Payer: Self-pay

## 2024-04-27 ENCOUNTER — Other Ambulatory Visit (HOSPITAL_COMMUNITY): Payer: Self-pay
